# Patient Record
Sex: Female | Born: 1970 | Race: Black or African American | Hispanic: No | Marital: Married | Smoking: Former smoker
Health system: Southern US, Community
[De-identification: ages and names within clinical notes are randomized; demographics above are authoritative.]

## PROBLEM LIST (undated history)

## (undated) DIAGNOSIS — R7303 Prediabetes: Secondary | ICD-10-CM

## (undated) DIAGNOSIS — M542 Cervicalgia: Secondary | ICD-10-CM

## (undated) DIAGNOSIS — K760 Fatty (change of) liver, not elsewhere classified: Secondary | ICD-10-CM

## (undated) DIAGNOSIS — R600 Localized edema: Secondary | ICD-10-CM

## (undated) DIAGNOSIS — E785 Hyperlipidemia, unspecified: Secondary | ICD-10-CM

## (undated) DIAGNOSIS — I10 Essential (primary) hypertension: Secondary | ICD-10-CM

## (undated) DIAGNOSIS — E559 Vitamin D deficiency, unspecified: Secondary | ICD-10-CM

## (undated) DIAGNOSIS — J45909 Unspecified asthma, uncomplicated: Secondary | ICD-10-CM

## (undated) DIAGNOSIS — H409 Unspecified glaucoma: Secondary | ICD-10-CM

## (undated) DIAGNOSIS — T783XXA Angioneurotic edema, initial encounter: Secondary | ICD-10-CM

## (undated) HISTORY — DX: Cervicalgia: M54.2

## (undated) HISTORY — DX: Hyperlipidemia, unspecified: E78.5

## (undated) HISTORY — DX: Morbid (severe) obesity due to excess calories: E66.01

## (undated) HISTORY — DX: Angioneurotic edema, initial encounter: T78.3XXA

## (undated) HISTORY — PX: NECK SURGERY: SHX720

## (undated) HISTORY — DX: Vitamin D deficiency, unspecified: E55.9

## (undated) HISTORY — DX: Unspecified glaucoma: H40.9

## (undated) HISTORY — DX: Unspecified asthma, uncomplicated: J45.909

## (undated) HISTORY — DX: Localized edema: R60.0

## (undated) HISTORY — DX: Essential (primary) hypertension: I10

## (undated) HISTORY — DX: Fatty (change of) liver, not elsewhere classified: K76.0

## (undated) HISTORY — PX: EYE SURGERY: SHX253

## (undated) HISTORY — DX: Prediabetes: R73.03

---

## 2008-03-08 ENCOUNTER — Encounter: Admission: RE | Admit: 2008-03-08 | Discharge: 2008-03-08 | Payer: Self-pay | Admitting: Neurosurgery

## 2008-05-16 ENCOUNTER — Ambulatory Visit (HOSPITAL_COMMUNITY): Admission: RE | Admit: 2008-05-16 | Discharge: 2008-05-17 | Payer: Self-pay | Admitting: Neurosurgery

## 2008-06-10 ENCOUNTER — Encounter: Admission: RE | Admit: 2008-06-10 | Discharge: 2008-06-10 | Payer: Self-pay | Admitting: Neurosurgery

## 2008-12-24 IMAGING — RF DG MYELOGRAM CERVICAL
9 series · 9 of 9 positions shown · IV contrast (omnipaque)
Comparison: Outside MRI from [facility] 02/16/2008

MYELOGRAM  INJECTION
TECHNIQUE: Informed consent was obtained from the patient prior to
the procedure, including potential complications of headache,
allergy, infection and pain. Specific instructions were given
regarding 24 hour bedrest post procedure to prevent post-LP
headache.  A timeout procedure was performed.  With the patient
prone, the lower back was prepped with Betadine.  1% Lidocaine was
used for local anesthesia.  Lumbar puncture was performed by the
radiologist at the L3-2level using a 22 gauge needle with return of
clear colorless  CSF.  Ninecc of Omnipaque 544was injected into the
subarachnoid space .
CLINICAL DATA: Neck pain, right arm pain
TECHNIQUE: Multidetector CT imaging of the cervical spine was
performed following myelography.  Multiplanar CT image
reconstructions were also generated.

[Series 1: myelogram  white · 1 of 1 slices shown (1 of 9)]
[im 1/1]
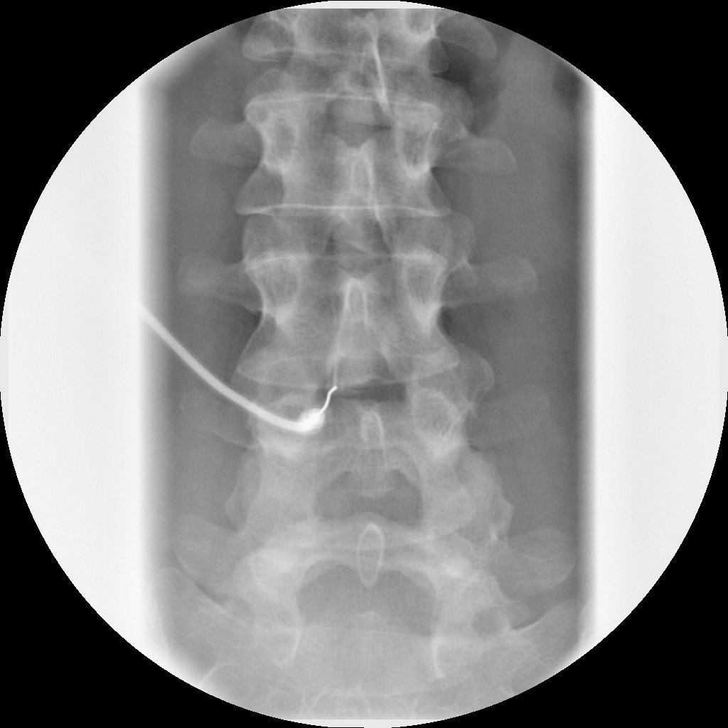

[Series 2: myelogram  white · 1 of 1 slices shown (2 of 9)]
[im 1/1]
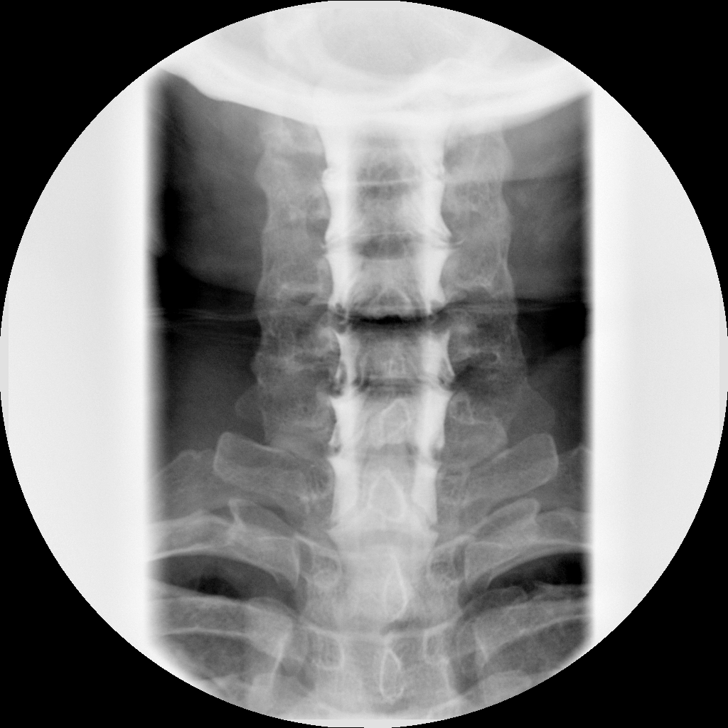

[Series 3: myelogram  white · 1 of 1 slices shown (3 of 9)]
[im 1/1]
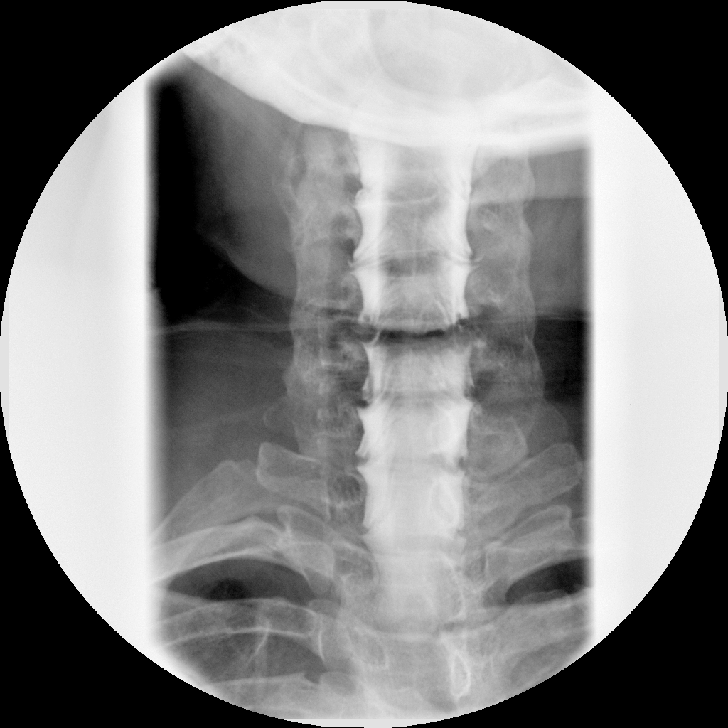

[Series 4: myelogram  white · 1 of 1 slices shown (4 of 9)]
[im 1/1]
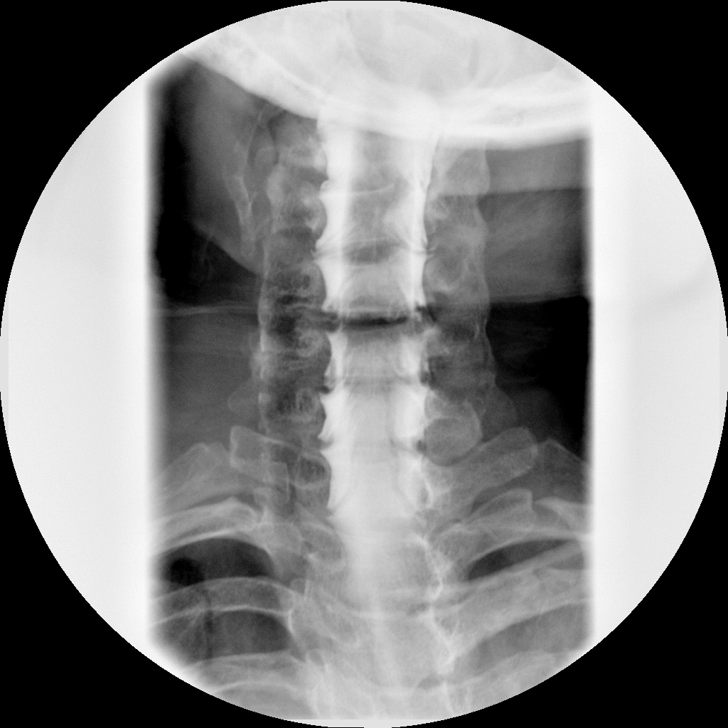

[Series 5: myelogram  white · 1 of 1 slices shown (5 of 9)]
[im 1/1]
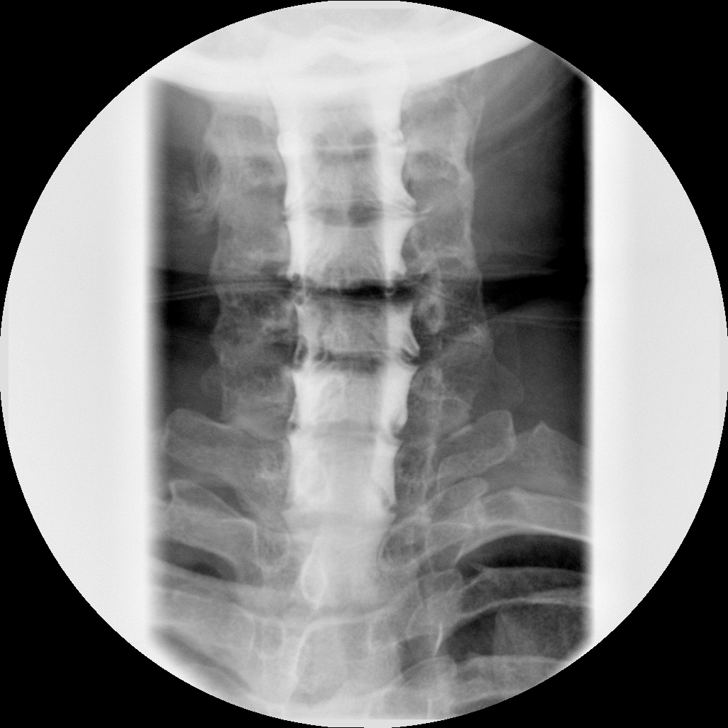

[Series 6: myelogram  white · 1 of 1 slices shown (6 of 9)]
[im 1/1]
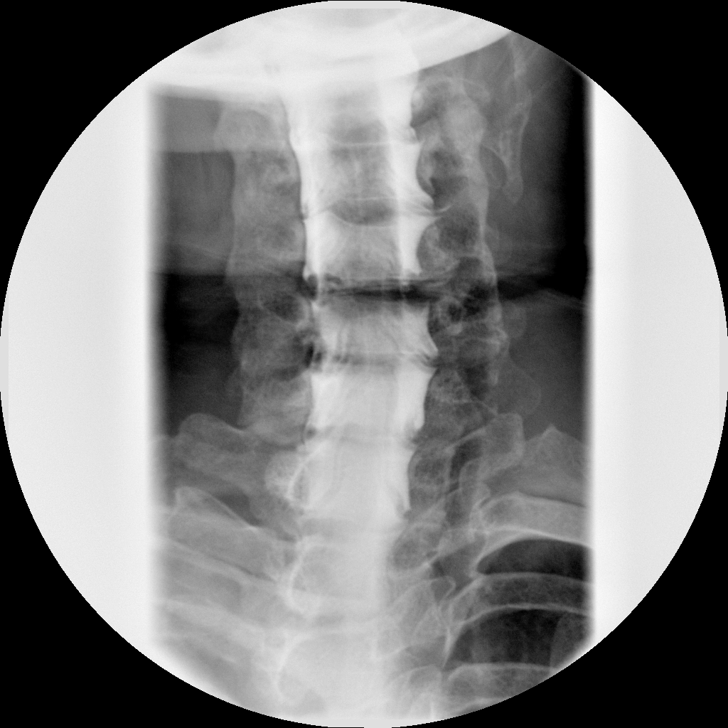

[Series 7: myelogram  white · 1 of 1 slices shown (7 of 9)]
[im 1/1]
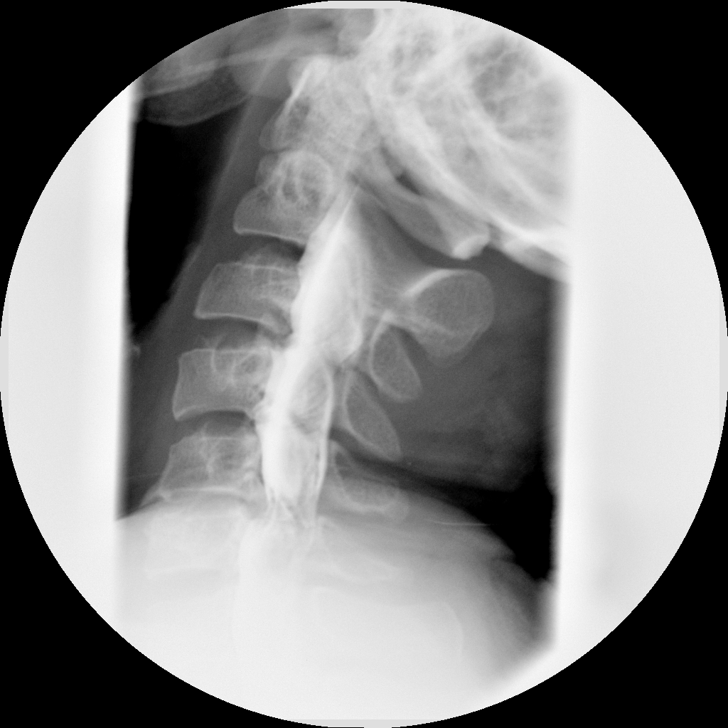

[Series 8: myelogram  white · 1 of 1 slices shown (8 of 9)]
[im 1/1]
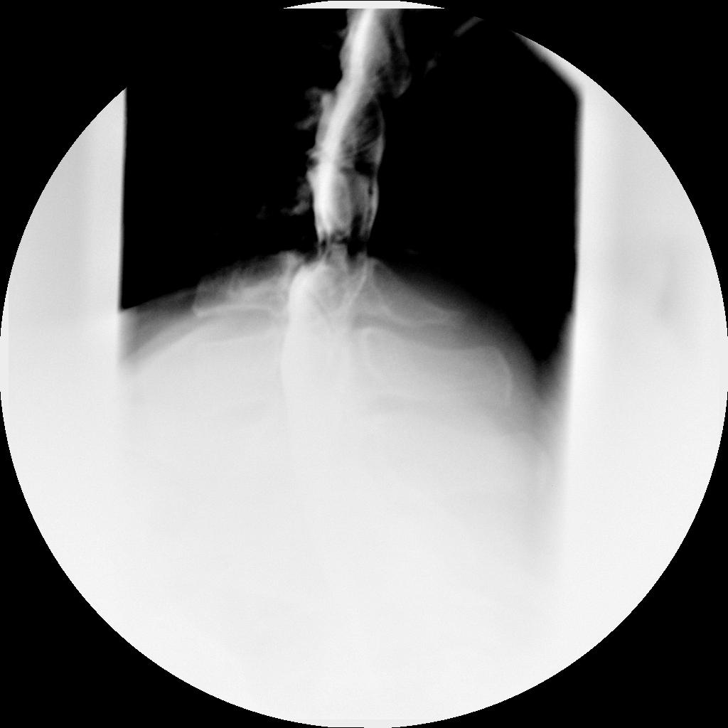

[Series 10: myelogram  white · 1 of 1 slices shown (9 of 9)]
[im 1/1]
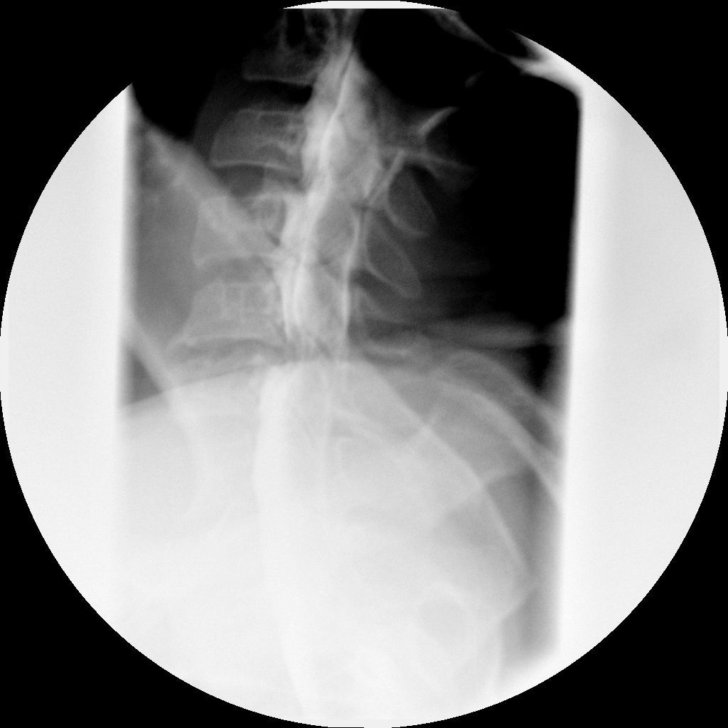

[9 of 9 positions shown; findings below may reference images not displayed]

IMPRESSION: Successful injection of  intrathecal contrast for myelography.

MYELOGRAM CERVICAL
FINDINGS: The contrast is noted in the cervical subarachnoid space.
There is disc space narrowing at C5-6 with a large ventral defect
consisting of soft tissue and bone.  There is significant spinal
stenosis with cord compression.  Bilateral C6 neural encroachment
is identified..  A smaller ventral defect is noted at C3-4.

Fluoroscopy Time: 1.33 minutes
IMPRESSION: As above

CT MYELOGRAPHY CERVICAL SPINE
The individual disc spaces were examined as follows:

C2-3:  Normal.

C3-4:  Central protrusion with effacement of the anterior
subarachnoid space and mild cord flattening.  No definite nerve
root encroachment. Canal diameter 7 mm.

C4-5:  Normal.

C5-6:  Broad-based central disc extrusion with osteophyte formation
above and below.  There is spinal stenosis with cord flattening and
bilateral C6 nerve root encroachment.  Canal diameter 6 mm.
Reversal of the normal cervical lordotic curve at this level.

C6-7:  Normal.

C7-T1:  Normal.

There is good general agreement with the prior MRI.
IMPRESSION: Broad-based central disc extrusion at C5-6 with osteophyte
formation; cord compression and bilateral C6 nerve root
encroachment is observed, exacerbated by reversal of the normal
cervical lordotic curve.

Central protrusion at C3-4, with mild cord flattening but no C4
nerve root encroachment.

## 2009-03-28 IMAGING — CR DG CERVICAL SPINE 1V
1 series · 1 of 1 positions shown · non-contrast
Comparison: Intraoperative films of 05/16/2008

CLINICAL DATA: Surgery 05/16/2008.

CERVICAL SPINE - 1 VIEW

[view not recorded]
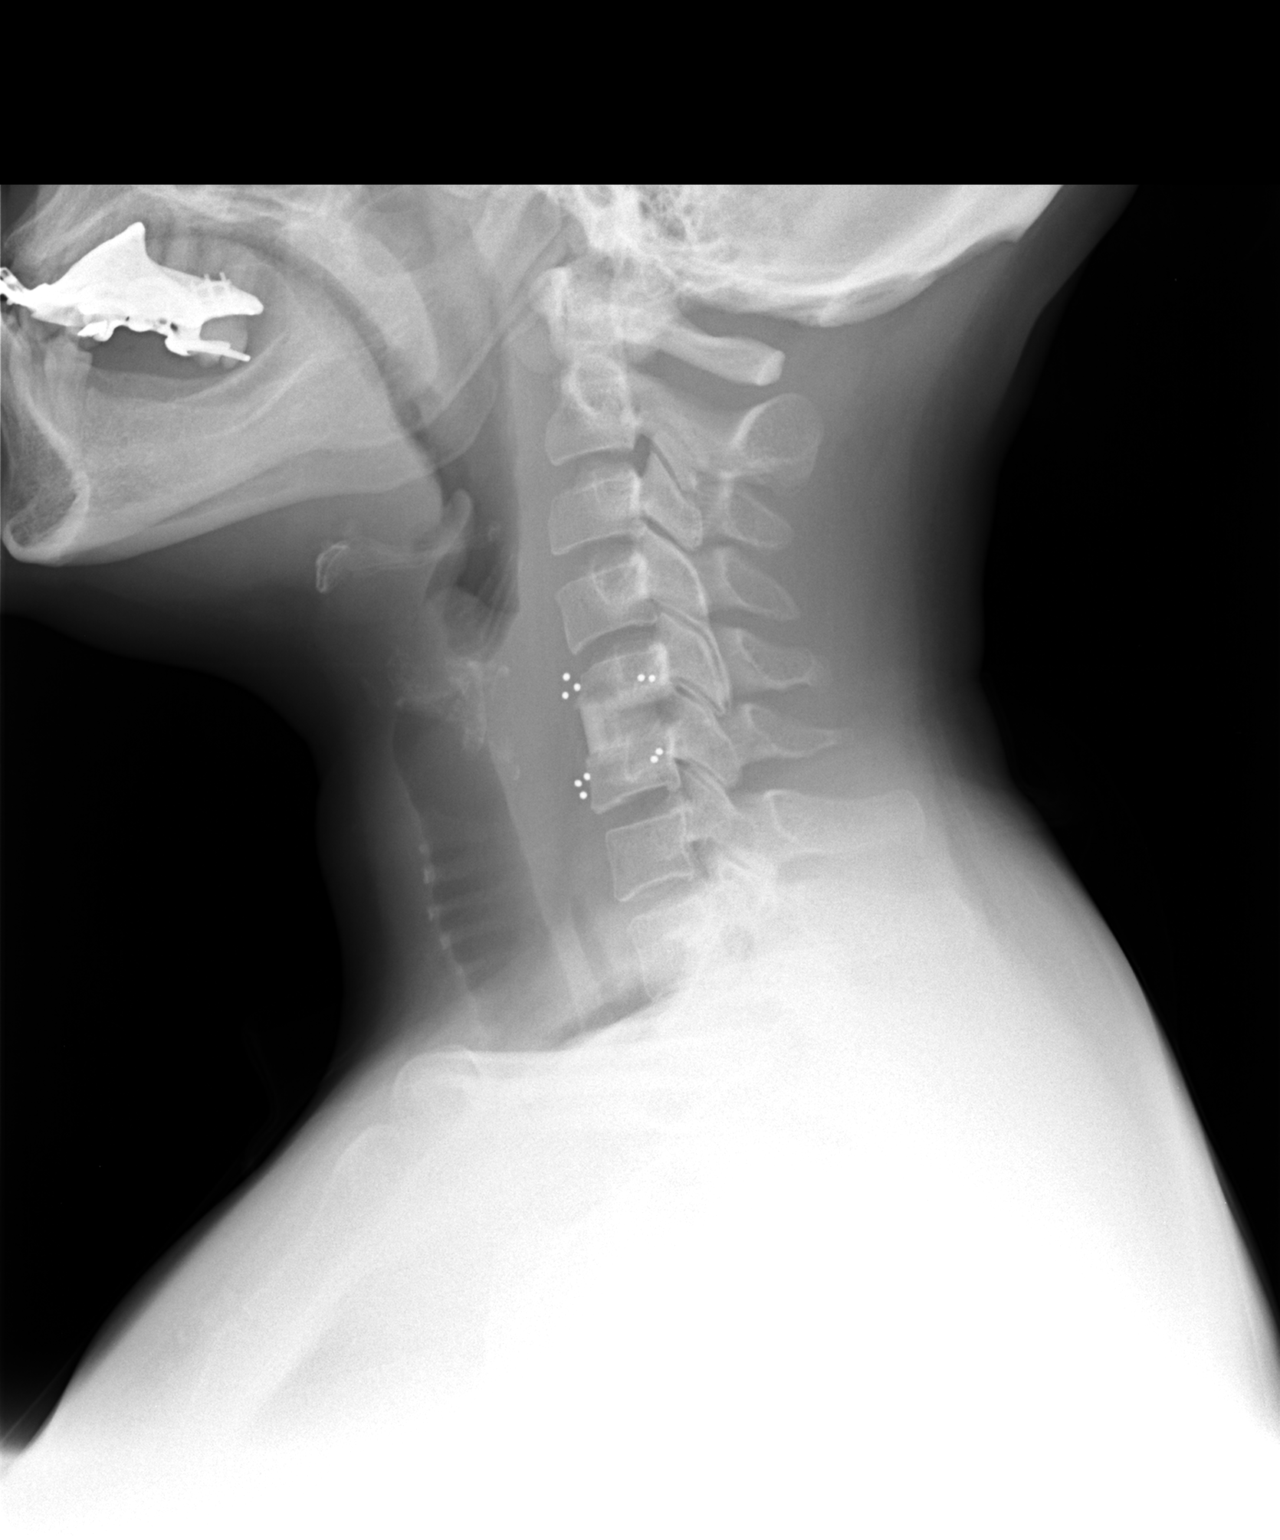

[1 of 1 positions shown; findings below may reference images not displayed]

FINDINGS: Single lateral view images through the bottom of T1.  The
prevertebral soft tissues are within normal limits.  The patient is
status post C5-C6 fixation.  Interbody fusion material is centrally
positioned.  Straightening of expected cervical lordosis is likely
postoperative.  Maintenance of vertebral body height.
IMPRESSION: Status post C5-C6 fixation without acute finding.

REF:G3 DICTATED: 06/10/2008 [DATE]

## 2010-05-24 ENCOUNTER — Encounter: Payer: Self-pay | Admitting: Geriatric Medicine

## 2010-08-17 LAB — CBC
MCV: 90.2 fL (ref 78.0–100.0)
RBC: 4.61 MIL/uL (ref 3.87–5.11)

## 2010-09-15 NOTE — Op Note (Signed)
NAME:  Dorothy Morgan, Dorothy Morgan             ACCOUNT NO.:  [id number]   MEDICAL RECORD NO.:  [id number]          PATIENT TYPE:  OIB   LOCATION:  3599                         FACILITY:  [facility]   PHYSICIAN:  Reinaldo Meeker, M.D. DATE OF BIRTH:  04/15/1971   DATE OF PROCEDURE:  05/16/2008  DATE OF DISCHARGE:                               OPERATIVE REPORT   PREOPERATIVE DIAGNOSIS:  Herniated disk, C5-6.   POSTOPERATIVE DIAGNOSIS:  Herniated disk, C5-6.   PROCEDURE:  C5-6 anterior cervical diskectomy with bone bank fusion  followed by Mystique anterior cervical plating.   SURGEON:  Reinaldo Meeker, MD   ASSISTANT:  Tia Alert, MD   PROCEDURE IN DETAIL:  After placing in the supine position and 5-pound  halter traction, the patient's neck was prepped and draped in the usual  sterile fashion.  Localizing fluoroscopy was used prior to incision to  identify the appropriate level.  Transverse incision was made in the  right anterior neck starting at the midline headed towards the medial  aspect of the sternocleidomastoid muscle.  The platysma muscle was then  incised transversely.  The natural fascial plane between the strap  muscles medially and sternocleidomastoid laterally was identified and  followed down to the anterior aspect of the cervical spine.  Longus  colli muscles were identified, split in the midline, stripped away  bilaterally with the Medical illustrator.  Self-retained  retractor was placed for exposure.  X-rays showed approach to the  appropriate level.  Using a 15 blade, the disk at C5-6 was incised.  Using pituitary rongeurs and curettes, approximately 90% of the disk  material was removed.  High-speed drill was used to widen the  interspace.  The microscope was draped, brought in the field, and used  at the end of the case.  Using microdissection technique, the remainder  of disk material on the post longitudinal ligament was removed.  Ligament was then  incised transversely and the cut edges removed with a  Kerrison punch.  Thorough decompression was carried out bilaterally on  the spinal dura and proximal foramen until the C6 nerve roots were well  visualized bilaterally.  There was a large amount of herniated disk  material within the foramen at C5-6 on the right.  This was removed  which gave excellent decompression of the underlying C6 nerve root.  At  this time, inspection was carried out in all directions for any evidence  of residual compression and none could be identified.  Large amounts of  irrigation were carried out and bleeding controlled with bipolar  coagulation and Gelfoam.  Measurements were taken.  It was elected to  somewhat over-distract the disk space with lordosis where 9 mm lordotic  plug was chosen.  The plug was then tapped without difficulty and  fluoroscopy showed to be in excellent position.  At 23-mm Mystique  anterior cervical plate was then chosen.  Under fluoroscopic guidance,  drill holes were placed followed by tapping, retapping, and placing 13  mm screws x4 until locking mechanism was secured to the plate.  Final  fluoroscopy showed the  plate screws and plug to be in good position.  Large amounts of the irrigation were carried at this time  and bleeding controlled with bipolar coagulation and Gelfoam.  The wound  was then closed with interrupted Vicryl in the platysma muscle and  inverted 5-0 PDS in the subcuticular layer and Steri-Strips on the skin.  Sterile dressing was then applied.  The patient was extubated and taken  to recovery room in stable condition.            ______________________________  Reinaldo Meeker, M.D.     ROK/MEDQ  D:  05/16/2008  T:  05/17/2008  Job:  [id number]

## 2014-05-03 HISTORY — PX: EYE SURGERY: SHX253

## 2014-07-26 DIAGNOSIS — H409 Unspecified glaucoma: Secondary | ICD-10-CM | POA: Insufficient documentation

## 2014-07-26 DIAGNOSIS — G629 Polyneuropathy, unspecified: Secondary | ICD-10-CM

## 2014-07-26 HISTORY — DX: Polyneuropathy, unspecified: G62.9

## 2014-09-07 DIAGNOSIS — K859 Acute pancreatitis without necrosis or infection, unspecified: Secondary | ICD-10-CM

## 2014-09-07 HISTORY — DX: Acute pancreatitis without necrosis or infection, unspecified: K85.90

## 2016-12-07 DIAGNOSIS — S46912A Strain of unspecified muscle, fascia and tendon at shoulder and upper arm level, left arm, initial encounter: Secondary | ICD-10-CM | POA: Insufficient documentation

## 2016-12-07 HISTORY — DX: Strain of unspecified muscle, fascia and tendon at shoulder and upper arm level, left arm, initial encounter: S46.912A

## 2017-04-21 ENCOUNTER — Ambulatory Visit (INDEPENDENT_AMBULATORY_CARE_PROVIDER_SITE_OTHER): Payer: BLUE CROSS/BLUE SHIELD | Admitting: Allergy and Immunology

## 2017-04-21 ENCOUNTER — Encounter: Payer: Self-pay | Admitting: Allergy and Immunology

## 2017-04-21 VITALS — BP 150/92 | HR 80 | Temp 98.5°F | Resp 16 | Ht 60.0 in | Wt 189.0 lb

## 2017-04-21 DIAGNOSIS — T783XXA Angioneurotic edema, initial encounter: Secondary | ICD-10-CM

## 2017-04-21 DIAGNOSIS — Z7722 Contact with and (suspected) exposure to environmental tobacco smoke (acute) (chronic): Secondary | ICD-10-CM

## 2017-04-21 DIAGNOSIS — T7840XA Allergy, unspecified, initial encounter: Secondary | ICD-10-CM | POA: Diagnosis not present

## 2017-04-21 MED ORDER — LORATADINE 10 MG PO TABS: 10.0000 mg | ORAL_TABLET | Freq: Two times a day (BID) | ORAL | 5 refills | Status: DC

## 2017-04-21 MED ORDER — AUVI-Q 0.3 MG/0.3ML IJ SOAJ: INTRAMUSCULAR | 3 refills | Status: DC

## 2017-04-21 MED ORDER — RANITIDINE HCL 150 MG PO TABS: 150.0000 mg | ORAL_TABLET | Freq: Two times a day (BID) | ORAL | 5 refills | Status: DC

## 2017-04-21 MED ORDER — MONTELUKAST SODIUM 10 MG PO TABS: 10.0000 mg | ORAL_TABLET | Freq: Every day | ORAL | 5 refills | Status: DC

## 2017-04-21 NOTE — Progress Notes (Signed)
Dear Dr. Nelson Chimes,  Thank you for referring [name] to the [facility] [place] on 04/21/2017.   Below is a summation of this patient's evaluation and recommendations.  Thank you for your referral. I will keep you informed about this patient's response to treatment.   If you have any questions please do not hesitate to contact me.   Sincerely,  Jessica Priest, MD Allergy / Immunology  [facility]   ______________________________________________________________________    NEW PATIENT NOTE  Referring Provider: Eloisa Northern, MD Primary Provider: Eloisa Northern, MD Date of office visit: 04/21/2017    Subjective:   Chief Complaint:  Dorothy Morgan (DOB: 03-21-1971) is a 46 y.o. female who presents to the clinic on 04/21/2017 with a chief complaint of Angioedema .     HPI: [place] presents to this clinic in evaluation of recurrent allergic reactions.  Over the course of the past month she has had 8 episodes of face, lip, tongue swelling without any associated systemic or constitutional symptoms and without any obvious trigger. Some of these episodes have been associated with difficulty swallowing and difficulty breathing when she lays down. Each episode lasts about 1-2 days and she has been administered systemic steroid at least 4 times for this issue.   She denies any significant health issue that has developed the past 6 months and she is not using any new medication of OTC agents the past 6 months and her environment has not changed significantly the past 6 months. She has not had any new GI or respiratory symptoms develop the past 6 months.  Past Medical History:  Diagnosis Date  . Angio-edema   . Asthma   . Glaucoma     Past Surgical History:  Procedure Laterality Date  . CESAREAN SECTION  1987  . EYE SURGERY Bilateral 2016  . NECK SURGERY  2010,2015    Allergies as  of 04/21/2017   No Known Allergies     Medication List      furosemide 40 MG tablet Commonly known as:  LASIX Take 40 mg by mouth as needed.   potassium chloride 10 MEQ tablet Commonly known as:  K-DUR Take 10 mEq by mouth as needed.   PROAIR HFA 108 (90 Base) MCG/ACT inhaler Generic drug:  albuterol Inhale 2 puffs into the lungs every 4 (four) hours as needed for wheezing or shortness of breath.       Review of systems negative except as noted in HPI / PMHx or noted below:  Review of Systems  Constitutional: Negative.   HENT: Negative.   Eyes: Negative.   Respiratory: Negative.   Cardiovascular: Negative.   Gastrointestinal: Negative.   Genitourinary: Negative.   Musculoskeletal: Negative.   Skin: Negative.   Neurological: Negative.   Endo/Heme/Allergies: Negative.   Psychiatric/Behavioral: Negative.     Family History  Problem Relation Age of Onset  . Asthma Mother   . High blood pressure Mother   . Diabetes Mother   . High blood pressure Sister   . Aneurysm Sister   . High blood pressure Brother   . High blood pressure Sister   . High blood pressure Brother     Social History   Socioeconomic History  . Marital status: Married    Spouse name: Not on file  . Number of children: Not on file  . Years of education: Not on file  . Highest education level:  Not on file  Social Needs  . Financial resource strain: Not on file  . Food insecurity - worry: Not on file  . Food insecurity - inability: Not on file  . Transportation needs - medical: Not on file  . Transportation needs - non-medical: Not on file  Occupational History  . Not on file  Tobacco Use  . Smoking status: Former Smoker    Packs/day: 1.00    Years: 20.00    Pack years: 20.00    Types: Cigarettes  . Smokeless tobacco: Never Used  Substance and Sexual Activity  . Alcohol use: Yes  . Drug use: No  . Sexual activity: Not on file  Other Topics Concern  . Not on file  Social History  Narrative  . Not on file    Environmental and Social history  Lives in a house with a dry environment, no animals located inside the household, carpet in the bedroom, plastic on the bed, no plastic on the pillow, or exposure to tobacco smoke inside the household, and working in a warehouse setting with shipping and receiving.  Objective:   Vitals:   04/21/17 0903  BP: (!) 150/92  Pulse: 80  Resp: 16  Temp: 98.5 F (36.9 C)   Height: 5' (152.4 cm) Weight: 189 lb (85.7 kg)  Physical Exam  Constitutional: She is well-developed, well-nourished, and in no distress.  HENT:  Head: Normocephalic. Head is without right periorbital erythema and without left periorbital erythema.  Right Ear: Tympanic membrane, external ear and ear canal normal.  Left Ear: Tympanic membrane, external ear and ear canal normal.  Nose: Nose normal. No mucosal edema or rhinorrhea.  Mouth/Throat: Oropharynx is clear and moist and mucous membranes are normal. No oropharyngeal exudate.  Eyes: Conjunctivae and lids are normal. Pupils are equal, round, and reactive to light.  Neck: Trachea normal. No tracheal deviation present. No thyromegaly present.  Cardiovascular: Normal rate, regular rhythm, S1 normal, S2 normal and normal heart sounds.  No murmur heard. Pulmonary/Chest: Effort normal. No stridor. No tachypnea. No respiratory distress. She has no wheezes. She has no rales. She exhibits no tenderness.  Abdominal: Soft. She exhibits no distension and no mass. There is no hepatosplenomegaly. There is no tenderness. There is no rebound and no guarding.  Musculoskeletal: She exhibits no edema or tenderness.  Lymphadenopathy:       Head (right side): No tonsillar adenopathy present.       Head (left side): No tonsillar adenopathy present.    She has no cervical adenopathy.    She has no axillary adenopathy.  Neurological: She is alert. Gait normal.  Skin: No rash noted. She is not diaphoretic. No erythema. No  pallor. Nails show no clubbing.  Psychiatric: Mood and affect normal.    Diagnostics: Allergy skin tests were performed. She demonstrated sensitivity to trees and molds and no sensitivity to foods  Assessment and Plan:    1. Allergic reaction, initial encounter   2. Angioedema, initial encounter   3. Second hand smoke exposure     1.  Allergen avoidance measures? Eliminate all indoor / car tobacco smoke exposure  2.  Use preventative medication plan:   A.  Loratadine 10 mg twice a day  B.  Ranitidine 150 mg twice a day  C.  Montelukast 10 mg once a day  3. Auvi-Q 0.3, Benadryl, MD/ER evaluation for allergic reaction  4. Blood - CBC w/diff, CMP, SED, CRP, C4, C1 esterase inhibitor level and function, C1q, TSH, T4,  TP, alpha-gal panel, + UA  5. 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  We will have her utilize preventative medications as noted above in an attempt to decrease immunological hyperreactivity and investigate her blood for source of this overactivity as noted above.  I will contact her with the results of her blood tests once they are available for review.  Further evaluation and treatment will be based upon her response to treatment and the results of her diagnostic testing.  Jessica PriestEric J. Laurell Coalson, MD Allergy / Immunology [facility] [place]

## 2017-04-21 NOTE — Patient Instructions (Addendum)
  1.  Allergen avoidance measures? Eliminate all indoor / car tobacco smoke exposure  2.  Use preventative medication plan:   A.  Loratadine 10 mg twice a day  B.  Ranitidine 150 mg twice a day  C.  Montelukast 10 mg once a day  3. Auvi-Q 0.3, Benadryl, MD/ER evaluation for allergic reaction  4. Blood - CBC w/diff, CMP, SED, CRP, C4, C1 esterase inhibitor level and function, C1q, TSH, T4, TP, alpha-gal panel, + UA  5. Return in 4 weeks or earlier if problem

## 2017-04-27 ENCOUNTER — Encounter: Payer: Self-pay | Admitting: Allergy and Immunology

## 2017-04-28 LAB — COMPREHENSIVE METABOLIC PANEL
ALK PHOS: 84 IU/L (ref 39–117)
ALT: 22 IU/L (ref 0–32)
AST: 19 IU/L (ref 0–40)
Albumin/Globulin Ratio: 1.4 (ref 1.2–2.2)
Albumin: 4.2 g/dL (ref 3.5–5.5)
BILIRUBIN TOTAL: 0.2 mg/dL (ref 0.0–1.2)
BUN/Creatinine Ratio: 9 (ref 9–23)
BUN: 7 mg/dL (ref 6–24)
CHLORIDE: 102 mmol/L (ref 96–106)
CO2: 23 mmol/L (ref 20–29)
Calcium: 9.3 mg/dL (ref 8.7–10.2)
Creatinine, Ser: 0.82 mg/dL (ref 0.57–1.00)
GFR calc Af Amer: 98 mL/min/{1.73_m2} (ref >59)
GFR calc non Af Amer: 85 mL/min/{1.73_m2} (ref >59)
GLUCOSE: 98 mg/dL (ref 65–99)
Globulin, Total: 3.1 g/dL (ref 1.5–4.5)
POTASSIUM: 4 mmol/L (ref 3.5–5.2)
Sodium: 139 mmol/L (ref 134–144)
TOTAL PROTEIN: 7.3 g/dL (ref 6.0–8.5)

## 2017-04-28 LAB — CBC WITH DIFFERENTIAL/PLATELET
BASOS ABS: 0 10*3/uL (ref 0.0–0.2)
Basos: 1 %
EOS (ABSOLUTE): 0.1 10*3/uL (ref 0.0–0.4)
Eos: 2 %
Hematocrit: 40.6 % (ref 34.0–46.6)
Hemoglobin: 13.7 g/dL (ref 11.1–15.9)
IMMATURE GRANS (ABS): 0 10*3/uL (ref 0.0–0.1)
Immature Granulocytes: 0 %
LYMPHS: 49 %
Lymphocytes Absolute: 2.7 10*3/uL (ref 0.7–3.1)
MCH: 30.6 pg (ref 26.6–33.0)
MCHC: 33.7 g/dL (ref 31.5–35.7)
MCV: 91 fL (ref 79–97)
MONOS ABS: 0.3 10*3/uL (ref 0.1–0.9)
Monocytes: 6 %
NEUTROS ABS: 2.3 10*3/uL (ref 1.4–7.0)
NEUTROS PCT: 42 %
PLATELETS: 443 10*3/uL — AB (ref 150–379)
RBC: 4.48 x10E6/uL (ref 3.77–5.28)
RDW: 13.6 % (ref 12.3–15.4)
WBC: 5.5 10*3/uL (ref 3.4–10.8)

## 2017-04-28 LAB — URINALYSIS
Bilirubin, UA: NEGATIVE
Glucose, UA: NEGATIVE
Ketones, UA: NEGATIVE
LEUKOCYTES UA: NEGATIVE
NITRITE UA: NEGATIVE
PH UA: 6 (ref 5.0–7.5)
Protein, UA: NEGATIVE
Specific Gravity, UA: 1.007 (ref 1.005–1.030)
UUROB: 0.2 mg/dL (ref 0.2–1.0)

## 2017-04-28 LAB — ALPHA-GAL PANEL
ALPHA GAL IGE: 0.28 kU/L — AB (ref <0.10)
BEEF CLASS INTERPRETATION: 0
Beef (Bos spp) IgE: <0.1 kU/L (ref <0.35)
LAMB CLASS INTERPRETATION: 0
PORK CLASS INTERPRETATION: 0
Pork (Sus spp) IgE: <0.1 kU/L (ref <0.35)

## 2017-04-28 LAB — TSH+FREE T4
Free T4: 1.24 ng/dL (ref 0.82–1.77)
TSH: 2.16 u[IU]/mL (ref 0.450–4.500)

## 2017-04-28 LAB — C4 COMPLEMENT: COMPLEMENT C4, SERUM: 34 mg/dL (ref 14–44)

## 2017-04-28 LAB — C1 ESTERASE INHIBITOR: C1INH SerPl-mCnc: 34 mg/dL (ref 21–39)

## 2017-04-28 LAB — THYROID PEROXIDASE ANTIBODY: Thyroperoxidase Ab SerPl-aCnc: 12 IU/mL (ref 0–34)

## 2017-04-28 LAB — COMPLEMENT COMPONENT C1Q: COMPLEMENT C1Q: 15.6 mg/dL (ref 11.8–24.4)

## 2017-04-28 LAB — C-REACTIVE PROTEIN: CRP: 10 mg/L — AB (ref 0.0–4.9)

## 2017-04-28 LAB — C1 ESTERASE INHIBITOR, FUNCTIONAL: C1INH Functional/C1INH Total MFr SerPl: 98 %mean normal

## 2017-04-28 LAB — SEDIMENTATION RATE: SED RATE: 11 mm/h (ref 0–32)

## 2017-05-20 DIAGNOSIS — H4020X0 Unspecified primary angle-closure glaucoma, stage unspecified: Secondary | ICD-10-CM

## 2017-05-20 HISTORY — DX: Unspecified primary angle-closure glaucoma, stage unspecified: H40.20X0

## 2017-05-23 ENCOUNTER — Encounter: Payer: Self-pay | Admitting: Allergy

## 2017-05-23 ENCOUNTER — Ambulatory Visit (INDEPENDENT_AMBULATORY_CARE_PROVIDER_SITE_OTHER): Payer: BLUE CROSS/BLUE SHIELD | Admitting: Allergy

## 2017-05-23 VITALS — BP 126/90 | HR 72 | Resp 16

## 2017-05-23 DIAGNOSIS — T7840XD Allergy, unspecified, subsequent encounter: Secondary | ICD-10-CM | POA: Diagnosis not present

## 2017-05-23 DIAGNOSIS — T783XXD Angioneurotic edema, subsequent encounter: Secondary | ICD-10-CM | POA: Diagnosis not present

## 2017-05-23 DIAGNOSIS — Z7722 Contact with and (suspected) exposure to environmental tobacco smoke (acute) (chronic): Secondary | ICD-10-CM | POA: Diagnosis not present

## 2017-05-23 NOTE — Patient Instructions (Addendum)
  1.  Avoid red meats  2.  Eliminate all indoor / car tobacco smoke exposure   3.  Use preventative medication plan:   A.  Loratadine 10 mg twice a day  B.  Ranitidine 150 mg twice a day  C.  Montelukast 10 mg once a day  4. Auvi-Q 0.3, Benadryl, MD/ER evaluation for allergic reaction  5. Will repeat CRP (inflammatory marker) to determine if rising, stable or dropping.  If level is normal or dropping nothing further to work-up.    6. Return in 3 months or earlier if problem

## 2017-05-23 NOTE — Progress Notes (Signed)
Follow-up Note  RE: Dorothy Morgan MRN: [id number] DOB: January 10, 1971 Date of Office Visit: 05/23/2017   History of present illness: Dorothy Morgan is a 47 y.o. female presenting today for follow-up of angioedema.  She was seen in the office for initial visit on April 21, 2017 by Dr. Lucie LeatherKozlow.  At this visit she was recommended to take loratadine twice a day, ranitidine twice a day as well as montelukast daily.  She states she has been taking loratadine twice a day, ranitidine once a day and singular once a day.  With this regimen she is only had one episode where her lips started to tingle as if he could swell and she took Benadryl and this prevented any swelling.  Prior to starting on this regimen she was having at least 8 episodes or so a month.  She definitely states this is an improvement.  She has been avoiding red meats based on labs that returned.  She also has access to auviq device.  She otherwise denies any changes with her health, surgeries or hospitalizations since last visit. She does state when she had her urinalysis done that she was on her menstrual cycle however this was not documented with the actual lab that showed 3+ RBCs.  Review of systems: Review of Systems  Constitutional: Negative for chills, fever and malaise/fatigue.  HENT: Negative for congestion, ear discharge, ear pain, nosebleeds, sore throat and tinnitus.   Eyes: Negative for pain, discharge and redness.  Respiratory: Negative for cough, shortness of breath and wheezing.   Cardiovascular: Negative for chest pain.  Gastrointestinal: Negative for abdominal pain, constipation, diarrhea, heartburn, nausea and vomiting.  Musculoskeletal: Negative for joint pain.  Skin: Negative for itching and rash.  Neurological: Negative for headaches.    All other systems negative unless noted above in HPI  Past medical/social/surgical/family history have been reviewed and are unchanged unless specifically  indicated below.  No changes  Medication List: Allergies as of 05/23/2017   No Known Allergies     Medication List        Accurate as of 05/23/17 12:04 PM. Always use your most recent med list.          AUVI-Q 0.3 mg/0.3 mL Soaj injection Generic drug:  EPINEPHrine Use as directed for life-threatening allergic reaction.   furosemide 40 MG tablet Commonly known as:  LASIX Take 40 mg by mouth as needed.   loratadine 10 MG tablet Commonly known as:  CLARITIN Take 1 tablet (10 mg total) by mouth 2 (two) times daily.   montelukast 10 MG tablet Commonly known as:  SINGULAIR Take 1 tablet (10 mg total) by mouth at bedtime.   potassium chloride 10 MEQ tablet Commonly known as:  K-DUR Take 10 mEq by mouth as needed.   PROAIR HFA 108 (90 Base) MCG/ACT inhaler Generic drug:  albuterol Inhale 2 puffs into the lungs every 4 (four) hours as needed for wheezing or shortness of breath.   ranitidine 150 MG tablet Commonly known as:  ZANTAC Take 1 tablet (150 mg total) by mouth 2 (two) times daily.       Known medication allergies: No Known Allergies   Physical examination: Blood pressure 126/90, pulse 72, resp. rate 16.  General: Alert, interactive, in no acute distress. HEENT: PERRLA, TMs pearly gray, turbinates non-edematous without discharge, post-pharynx non erythematous. Neck: Supple without lymphadenopathy. Lungs: Clear to auscultation without wheezing, rhonchi or rales. {no increased work of breathing. CV: Normal S1, S2 without murmurs. Abdomen:  Nondistended, nontender. Skin: Warm and dry, without lesions or rashes. Extremities:  No clubbing, cyanosis or edema. Neuro:   Grossly intact.  Diagnositics/Labs: Labs:  Component     Latest Ref Rng & Units 04/21/2017  C1INH Functional/C1INH Total MFr SerPl     %mean normal 98   Component     Latest Ref Rng & Units 04/21/2017  Complement C4, Serum     14 - 44 mg/dL 34  Complement W0J     11.8 - 24.4 mg/dL 81.1     Component     Latest Ref Rng & Units 04/21/2017  Beef (Bos spp) IgE     <0.35 kU/L <0.10  Class Interpretation      0  Lamb/Mutton (Ovis spp) IgE     <0.35 kU/L <0.10  Class Interpretation      0  Pork (Sus spp) IgE     <0.35 kU/L <0.10  Class Interpretation      0  Alpha Gal IgE*     <0.10 kU/L 0.28 (H)   Component     Latest Ref Rng & Units 04/21/2017  Specific Gravity, UA     1.005 - 1.030 1.007  pH, UA     5.0 - 7.5 6.0  Color, UA     Yellow Yellow  Appearance Ur     Clear Clear  Leukocytes, UA     Negative Negative  Protein, UA     Negative/Trace Negative  Glucose     Negative Negative  Ketones, UA     Negative Negative  RBC, UA     Negative 3+ (A)  Bilirubin, UA     Negative Negative  Urobilinogen, Ur     0.2 - 1.0 mg/dL 0.2  Nitrite, UA     Negative Negative  RBCs due to menses  Component     Latest Ref Rng & Units 04/21/2017  TSH     0.450 - 4.500 uIU/mL 2.160  T4,Free(Direct)     0.82 - 1.77 ng/dL 9.14  Thyroperoxidase Ab SerPl-aCnc     0 - 34 IU/mL 12   Component     Latest Ref Rng & Units 04/21/2017  Sed Rate     0 - 32 mm/hr 11  CRP     0.0 - 4.9 mg/L 10.0 (H)   Component     Latest Ref Rng & Units 04/21/2017  Glucose     65 - 99 mg/dL 98  BUN     6 - 24 mg/dL 7  Creatinine     7.82 - 1.00 mg/dL 9.56  GFR, Est Non African American     >59 mL/min/1.73 85  GFR, Est African American     >59 mL/min/1.73 98  BUN/Creatinine Ratio     9 - 23 9  Sodium     134 - 144 mmol/L 139  Potassium     3.5 - 5.2 mmol/L 4.0  Chloride     96 - 106 mmol/L 102  CO2     20 - 29 mmol/L 23  Calcium     8.7 - 10.2 mg/dL 9.3  Total Protein     6.0 - 8.5 g/dL 7.3  Albumin     3.5 - 5.5 g/dL 4.2  Globulin, Total     1.5 - 4.5 g/dL 3.1  Albumin/Globulin Ratio     1.2 - 2.2 1.4  Total Bilirubin     0.0 - 1.2 mg/dL 0.2  Alkaline Phosphatase     39 -  117 IU/L 84  AST     0 - 40 IU/L 19  ALT     0 - 32 IU/L 22   Component     Latest  Ref Rng & Units 04/21/2017  WBC     3.4 - 10.8 x10E3/uL 5.5  RBC     3.77 - 5.28 x10E6/uL 4.48  Hemoglobin     11.1 - 15.9 g/dL 16.1  HCT     09.6 - 04.5 % 40.6  MCV     79 - 97 fL 91  MCH     26.6 - 33.0 pg 30.6  MCHC     31.5 - 35.7 g/dL 40.9  RDW     81.1 - 91.4 % 13.6  Platelets     150 - 379 x10E3/uL 443 (H)  Neutrophils     Not Estab. % 42  Lymphs     Not Estab. % 49  Monocytes     Not Estab. % 6  Eos     Not Estab. % 2  Basos     Not Estab. % 1  NEUT#     1.4 - 7.0 x10E3/uL 2.3  Lymphocyte #     0.7 - 3.1 x10E3/uL 2.7  Monocytes Absolute     0.1 - 0.9 x10E3/uL 0.3  EOS (ABSOLUTE)     0.0 - 0.4 x10E3/uL 0.1  Basophils Absolute     0.0 - 0.2 x10E3/uL 0.0  Immature Granulocytes     Not Estab. % 0  Immature Grans (Abs)     0.0 - 0.1 x10E3/uL 0.0   Assessment and plan:   Allergic reaction Angioedema Secondhand smoke exposure  She has had decreased episodes of angioedema with the use of preventative medications below of which she will continue at this time.  I advised that she needs to go a certain period of time without symptoms before considering weaning her medications.  1.  Avoid red meats.  She has low-level sensitivity to alpha gal IgE.   2.  Eliminate all indoor / car tobacco smoke exposure   3.  Continue preventative medication plan:   A.  Loratadine 10 mg twice a day  B.  Ranitidine 150 mg twice a day  C.  Montelukast 10 mg once a day  4. Auvi-Q 0.3, Benadryl, MD/ER evaluation for allergic reaction  5. Will repeat CRP (inflammatory marker) to determine if rising, stable or dropping.  If level is normal or dropping no additional testing needed.    6. Return in 3 months or earlier if problem   I appreciate the opportunity to take part in Chellsie's care. Please do not hesitate to contact me with questions.  Sincerely,   Margo Aye, MD Allergy/Immunology [facility]

## 2017-05-24 LAB — C-REACTIVE PROTEIN: CRP: 7.1 mg/L — AB (ref 0.0–4.9)

## 2017-08-04 DIAGNOSIS — H52221 Regular astigmatism, right eye: Secondary | ICD-10-CM | POA: Diagnosis not present

## 2017-08-04 DIAGNOSIS — H401132 Primary open-angle glaucoma, bilateral, moderate stage: Secondary | ICD-10-CM | POA: Diagnosis not present

## 2017-08-09 DIAGNOSIS — M7989 Other specified soft tissue disorders: Secondary | ICD-10-CM | POA: Diagnosis not present

## 2017-08-09 DIAGNOSIS — J45909 Unspecified asthma, uncomplicated: Secondary | ICD-10-CM | POA: Diagnosis not present

## 2017-08-09 DIAGNOSIS — R7303 Prediabetes: Secondary | ICD-10-CM | POA: Diagnosis not present

## 2017-08-09 DIAGNOSIS — B373 Candidiasis of vulva and vagina: Secondary | ICD-10-CM | POA: Diagnosis not present

## 2017-08-23 ENCOUNTER — Ambulatory Visit (INDEPENDENT_AMBULATORY_CARE_PROVIDER_SITE_OTHER): Payer: BLUE CROSS/BLUE SHIELD | Admitting: Allergy

## 2017-08-23 ENCOUNTER — Encounter: Payer: Self-pay | Admitting: Allergy

## 2017-08-23 VITALS — BP 124/80 | HR 80 | Resp 18

## 2017-08-23 DIAGNOSIS — T783XXD Angioneurotic edema, subsequent encounter: Secondary | ICD-10-CM

## 2017-08-23 DIAGNOSIS — Z7722 Contact with and (suspected) exposure to environmental tobacco smoke (acute) (chronic): Secondary | ICD-10-CM

## 2017-08-23 DIAGNOSIS — T7840XD Allergy, unspecified, subsequent encounter: Secondary | ICD-10-CM | POA: Diagnosis not present

## 2017-08-23 NOTE — Patient Instructions (Signed)
  1.  Avoid red meats  2.  Eliminate all indoor / car tobacco smoke exposure   3.  Use preventative medication plan (will start to wean as follows):   A.  Loratadine 10 mg twice a day  B.  Ranitidine 150 mg twice a day  C.  Montelukast 10 mg once a day  Weaning schedule - take away evening dose of ranitidine x 1 week.  If no reaction/swelling then take away morning dose of ranitidine x 1 week.  If no reaction/swelling then take away evening dose of loratadine and continue loratidine and montelukast daily.    If at any point in the wean you develop reaction/swelling then add back in the medication dose last weaned.    4. Auvi-Q 0.3, Benadryl, MD/ER evaluation for allergic reaction  5. For nasal congestion/drainage (seen on exam) use nasacort 2 sprays each nostril daily.  Use for 1-2 weeks at time before stopping once symptoms improve.   6. Return in 4-6 months or earlier if problem

## 2017-08-23 NOTE — Progress Notes (Signed)
Follow-up Note  RE: Dorothy Morgan MRN: [id number] DOB: 09-18-1970 Date of Office Visit: 08/23/2017   History of present illness: Dorothy Morgan is a 47 y.o. female presenting today for follow-up of angioedema.  She was last seen in the office on 05/23/17 by myself.  Since his visit she has not had any further episodes of angioedema.  She is taking loratadine 1 tab twice a day, ranitidine 1 tab twice a day and singulair daily.  She does state that she has on several occasion has had red meat of which she has sensitivity to alpha gal.  She states she will take benadryl prior and after eating as well has having her epinephrine device.  She states no reactions have occurred with red meat ingestion.  She denies any major health changes, surgeries or hospitalizations since last visit.    Review of systems: Review of Systems  Constitutional: Negative for chills, fever and malaise/fatigue.  HENT: Negative for congestion, ear discharge, ear pain, nosebleeds, sinus pain and sore throat.   Eyes: Negative for pain, discharge and redness.  Respiratory: Negative for cough, shortness of breath and wheezing.   Cardiovascular: Negative for chest pain.  Gastrointestinal: Negative for abdominal pain, constipation, diarrhea, heartburn, nausea and vomiting.  Musculoskeletal: Negative for joint pain.  Skin: Negative for itching and rash.  Neurological: Negative for headaches.    All other systems negative unless noted above in HPI  Past medical/social/surgical/family history have been reviewed and are unchanged unless specifically indicated below.  No changes  Medication List: Allergies as of 08/23/2017      Reactions   Beef Extract Swelling   Alpha Gal       Medication List        Accurate as of 08/23/17  3:16 PM. Always use your most recent med list.          AUVI-Q 0.3 mg/0.3 mL Soaj injection Generic drug:  EPINEPHrine Use as directed for life-threatening allergic  reaction.   furosemide 40 MG tablet Commonly known as:  LASIX Take 40 mg by mouth as needed.   loratadine 10 MG tablet Commonly known as:  CLARITIN Take 1 tablet (10 mg total) by mouth 2 (two) times daily.   montelukast 10 MG tablet Commonly known as:  SINGULAIR Take 1 tablet (10 mg total) by mouth at bedtime.   potassium chloride 10 MEQ tablet Commonly known as:  K-DUR Take 10 mEq by mouth as needed.   PROAIR HFA 108 (90 Base) MCG/ACT inhaler Generic drug:  albuterol Inhale 2 puffs into the lungs every 4 (four) hours as needed for wheezing or shortness of breath.   ranitidine 150 MG tablet Commonly known as:  ZANTAC Take 1 tablet (150 mg total) by mouth 2 (two) times daily.       Known medication allergies: Allergies  Allergen Reactions  . Beef Extract Swelling    Alpha Gal        Physical examination: Blood pressure 124/80, pulse 80, resp. rate 18.  General: Alert, interactive, in no acute distress. HEENT: PERRLA, TMs pearly gray, turbinates moderately edematous with clear discharge, post-pharynx non erythematous. Neck: Supple without lymphadenopathy. Lungs: Clear to auscultation without wheezing, rhonchi or rales. {no increased work of breathing. CV: Normal S1, S2 without murmurs. Abdomen: Nondistended, nontender. Skin: Warm and dry, without lesions or rashes. Extremities:  No clubbing, cyanosis or edema. Neuro:   Grossly intact.  Diagnositics/Labs: None today  Assessment and plan:   Angioedema/allergic reaction -  She  has not had further swelling or reactions since last visit.  Thus at this time will try to wean off medications as below.   Second hand smoke exposure  1.  Avoid red meats  2.  Eliminate all indoor / car tobacco smoke exposure   3.  Use preventative medication plan (will start to wean as follows):   A.  Loratadine 10 mg twice a day  B.  Ranitidine 150 mg twice a day  C.  Montelukast 10 mg once a day  Weaning schedule - take away  evening dose of ranitidine x 1 week.  If no reaction/swelling then take away morning dose of ranitidine x 1 week.  If no reaction/swelling then take away evening dose of loratadine and continue loratidine and montelukast daily.    If at any point in the wean you develop reaction/swelling then add back in the medication dose last weaned.    4. Auvi-Q 0.3, Benadryl, MD/ER evaluation for allergic reaction  5. For nasal congestion/drainage (seen on exam) use nasacort 2 sprays each nostril daily.  Use for 1-2 weeks at time before stopping once symptoms improve.   6. Return in 4-6 months or earlier if problem   I appreciate the opportunity to take part in Steffany's care. Please do not hesitate to contact me with questions.  Sincerely,   Margo AyeShaylar Rhema Boyett, MD Allergy/Immunology [facility]

## 2017-12-01 DIAGNOSIS — R1013 Epigastric pain: Secondary | ICD-10-CM | POA: Diagnosis not present

## 2017-12-01 DIAGNOSIS — M545 Low back pain: Secondary | ICD-10-CM | POA: Diagnosis not present

## 2017-12-01 DIAGNOSIS — R109 Unspecified abdominal pain: Secondary | ICD-10-CM | POA: Diagnosis not present

## 2017-12-01 DIAGNOSIS — R1012 Left upper quadrant pain: Secondary | ICD-10-CM | POA: Diagnosis not present

## 2017-12-15 DIAGNOSIS — I10 Essential (primary) hypertension: Secondary | ICD-10-CM | POA: Insufficient documentation

## 2017-12-15 DIAGNOSIS — E119 Type 2 diabetes mellitus without complications: Secondary | ICD-10-CM

## 2017-12-15 DIAGNOSIS — E669 Obesity, unspecified: Secondary | ICD-10-CM

## 2017-12-15 DIAGNOSIS — J45909 Unspecified asthma, uncomplicated: Secondary | ICD-10-CM | POA: Insufficient documentation

## 2017-12-15 DIAGNOSIS — Z6839 Body mass index (BMI) 39.0-39.9, adult: Secondary | ICD-10-CM | POA: Insufficient documentation

## 2017-12-15 HISTORY — DX: Type 2 diabetes mellitus without complications: E11.9

## 2017-12-15 HISTORY — DX: Obesity, unspecified: E66.9

## 2017-12-16 ENCOUNTER — Encounter: Payer: Self-pay | Admitting: Sports Medicine

## 2017-12-16 ENCOUNTER — Ambulatory Visit (INDEPENDENT_AMBULATORY_CARE_PROVIDER_SITE_OTHER): Payer: BLUE CROSS/BLUE SHIELD | Admitting: Sports Medicine

## 2017-12-16 VITALS — BP 124/80 | HR 65 | Resp 15 | Ht 61.0 in | Wt 180.0 lb

## 2017-12-16 DIAGNOSIS — L301 Dyshidrosis [pompholyx]: Secondary | ICD-10-CM

## 2017-12-16 DIAGNOSIS — M79671 Pain in right foot: Secondary | ICD-10-CM

## 2017-12-16 DIAGNOSIS — L309 Dermatitis, unspecified: Secondary | ICD-10-CM

## 2017-12-16 MED ORDER — CLOBETASOL PROP EMOLLIENT BASE 0.05 % EX CREA: TOPICAL_CREAM | CUTANEOUS | 0 refills | Status: DC

## 2017-12-16 NOTE — Progress Notes (Signed)
Subjective: Teressa LowerVictoria Tenant Parsons is a 47 y.o. female patient who presents to office for evaluation of right foot pain. Patient complains of progressive pain especially at the right foot at the arch for the last 4 months.  Ranks pain 4/10 and is now interferring with daily activities because the area is so itchy. Patient has tried cortisone cream with no relief in symptoms. Patient denies any other pedal complaints. Denies injury/trip/fall/sprain/any causative factors.   Review of Systems  Skin: Positive for itching and rash.  All other systems reviewed and are negative.    Patient Active Problem List   Diagnosis Date Noted  . Asthma 12/15/2017  . Diabetes mellitus (HCC) 12/15/2017  . Hypertension 12/15/2017  . Obesity (BMI 30-39.9) 12/15/2017  . Primary angle-closure glaucoma 05/20/2017  . Strain of left shoulder 12/07/2016  . Acute pancreatitis 09/07/2014  . Glaucoma 07/26/2014  . Neuropathy 07/26/2014    Current Outpatient Medications on File Prior to Visit  Medication Sig Dispense Refill  . albuterol (PROAIR HFA) 108 (90 Base) MCG/ACT inhaler Inhale 2 puffs into the lungs every 4 (four) hours as needed for wheezing or shortness of breath.    Marland Kitchen. AUVI-Q 0.3 MG/0.3ML SOAJ injection Use as directed for life-threatening allergic reaction. 4 Device 3  . furosemide (LASIX) 40 MG tablet Take 40 mg by mouth as needed.     . potassium chloride (K-DUR) 10 MEQ tablet Take 10 mEq by mouth as needed.      No current facility-administered medications on file prior to visit.     Allergies  Allergen Reactions  . Beef Extract Swelling    Alpha Gal       Objective:  General: Alert and oriented x3 in no acute distress  Dermatology:There is a solitary plaque at the right medial arch with crusted over skin that is pruritic in nature with no surrounding signs of infection. No webspace macerations, no ecchymosis bilateral, all nails x 10 are well manicured.  Vascular: Dorsalis Pedis and  Posterior Tibial pedal pulses palpable, Capillary Fill Time 3 seconds,(+) pedal hair growth bilateral, no edema bilateral lower extremities, Temperature gradient within normal limits.  Neurology: Gross sensation intact via light touch bilateral.  Musculoskeletal: Mild tenderness with palpation at lesion/plaque on the right medial arch, pes planus foot type, strength within normal limits in all groups bilateral.   Assessment and Plan: Problem List Items Addressed This Visit    None    Visit Diagnoses    Vesicular palmoplantar eczema of foot    -  Primary   Relevant Medications   Clobetasol Prop Emollient Base 0.05 % emollient cream   Right foot pain          -Complete examination performed -Discussed treatement options for eczema versus tinea -Rx clobetasol and advised patient use of the skin emollients for eczema over-the-counter -Recommend good hygiene habits -Patient to return to office in 6 weeks for medication check or sooner if condition worsens.  Asencion Islamitorya [place] Vandergriff, DPM

## 2017-12-16 NOTE — Progress Notes (Signed)
   Subjective:    Patient ID: Dorothy Morgan, female    DOB: 1970-06-25, 47 y.o.   MRN: [id number]  HPI    Review of Systems  All other systems reviewed and are negative.      Objective:   Physical Exam        Assessment & Plan:

## 2018-01-24 ENCOUNTER — Ambulatory Visit: Payer: BLUE CROSS/BLUE SHIELD | Admitting: Allergy

## 2018-01-27 ENCOUNTER — Encounter: Payer: Self-pay | Admitting: Sports Medicine

## 2018-01-27 ENCOUNTER — Ambulatory Visit (INDEPENDENT_AMBULATORY_CARE_PROVIDER_SITE_OTHER): Payer: BLUE CROSS/BLUE SHIELD | Admitting: Sports Medicine

## 2018-01-27 DIAGNOSIS — L301 Dyshidrosis [pompholyx]: Secondary | ICD-10-CM

## 2018-01-27 DIAGNOSIS — L309 Dermatitis, unspecified: Secondary | ICD-10-CM

## 2018-01-27 DIAGNOSIS — R238 Other skin changes: Secondary | ICD-10-CM | POA: Diagnosis not present

## 2018-01-27 DIAGNOSIS — M79671 Pain in right foot: Secondary | ICD-10-CM | POA: Diagnosis not present

## 2018-01-27 DIAGNOSIS — L858 Other specified epidermal thickening: Secondary | ICD-10-CM | POA: Diagnosis not present

## 2018-01-27 DIAGNOSIS — R234 Changes in skin texture: Secondary | ICD-10-CM | POA: Diagnosis not present
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
## 2018-01-29 DIAGNOSIS — J029 Acute pharyngitis, unspecified: Secondary | ICD-10-CM | POA: Diagnosis not present

## 2018-01-29 DIAGNOSIS — J310 Chronic rhinitis: Secondary | ICD-10-CM | POA: Diagnosis not present

## 2018-01-29 DIAGNOSIS — Z87891 Personal history of nicotine dependence: Secondary | ICD-10-CM | POA: Diagnosis not present

## 2018-01-29 DIAGNOSIS — I10 Essential (primary) hypertension: Secondary | ICD-10-CM | POA: Diagnosis not present

## 2018-01-29 DIAGNOSIS — H938X2 Other specified disorders of left ear: Secondary | ICD-10-CM | POA: Diagnosis not present

## 2018-01-29 DIAGNOSIS — E119 Type 2 diabetes mellitus without complications: Secondary | ICD-10-CM | POA: Diagnosis not present

## 2018-02-17 ENCOUNTER — Ambulatory Visit (INDEPENDENT_AMBULATORY_CARE_PROVIDER_SITE_OTHER): Payer: BLUE CROSS/BLUE SHIELD | Admitting: Family Medicine

## 2018-02-17 ENCOUNTER — Encounter: Payer: Self-pay | Admitting: Family Medicine

## 2018-02-17 VITALS — BP 138/88 | HR 80 | Resp 18

## 2018-02-17 DIAGNOSIS — J301 Allergic rhinitis due to pollen: Secondary | ICD-10-CM

## 2018-02-17 DIAGNOSIS — J4541 Moderate persistent asthma with (acute) exacerbation: Secondary | ICD-10-CM

## 2018-02-17 DIAGNOSIS — T783XXD Angioneurotic edema, subsequent encounter: Secondary | ICD-10-CM

## 2018-02-17 DIAGNOSIS — T783XXA Angioneurotic edema, initial encounter: Secondary | ICD-10-CM | POA: Insufficient documentation

## 2018-02-17 DIAGNOSIS — J45909 Unspecified asthma, uncomplicated: Secondary | ICD-10-CM | POA: Diagnosis not present

## 2018-02-17 HISTORY — DX: Allergic rhinitis due to pollen: J30.1

## 2018-02-17 HISTORY — DX: Moderate persistent asthma with (acute) exacerbation: J45.41

## 2018-02-17 MED ORDER — MONTELUKAST SODIUM 10 MG PO TABS: ORAL_TABLET | ORAL | 5 refills | Status: DC

## 2018-02-17 MED ORDER — FAMOTIDINE 20 MG PO TABS: ORAL_TABLET | ORAL | 5 refills | Status: DC

## 2018-02-17 MED ORDER — LORATADINE 10 MG PO TABS: ORAL_TABLET | ORAL | 5 refills | Status: DC

## 2018-02-17 MED ORDER — FLUTICASONE PROPIONATE HFA 110 MCG/ACT IN AERO: INHALATION_SPRAY | RESPIRATORY_TRACT | 5 refills | Status: DC

## 2018-02-17 NOTE — Patient Instructions (Addendum)
  1.  Avoid mammalian meats.  2.  Eliminate all indoor / car tobacco smoke exposure   3.  Use preventative medication plan (will start to wean as follows):   A.  Loratadine 10 mg twice a day  B.  Famotidine 20 mg twice a day  C.  Montelukast 10 mg once a day  Weaning schedule - take away evening dose of  famotidine x 1 week.  If no reaction/swelling then take away morning dose of famotadine x 1 week.  If no reaction/swelling then take away evening dose of loratadine and continue loratidine and montelukast daily.    If at any point in the wean you develop reaction/swelling then add back in the medication dose last weaned.    4. Auvi-Q 0.3, Benadryl, MD/ER evaluation for allergic reaction  5. For nasal congestion/drainage use nasacort 2 sprays each nostril daily.  Use for 1-2 weeks at time before stopping once symptoms improve.   6. For your breathing:   A. ProAir 2 puffs every 4 hours as needed for cough or wheeze. You may use ProAir 5-15 minutes before activity to decrease cough or wheeze.   B. Prednisone 10 mg tablets. Take 2 tablets once a day for 4 days, then take 1 tablet on the 5th day and stop.   C. Montelukast 10 mg once a day as above  D. Begin Flovent 110- 2 puffs twice a day with a spacer to prevent cough or wheeze  7. Return in 3 months or earlier if problem

## 2018-02-17 NOTE — Progress Notes (Addendum)
[address] [place] [address] Dept: [phone]  FOLLOW UP NOTE  Patient ID: Dorothy Morgan, female    DOB: October 16, 1970  Age: 47 y.o. MRN: [id number] Date of Office Visit: 02/17/2018  Assessment  Chief Complaint: Oral Swelling (Since Sunday )  HPI Dorothy Morgan is a 47 year old female who presents to the clinic for an evaluation of lip swelling. She was last seen in this clinic on 08/23/2017 by Dr. Delorse Lek for evaluation of angioedema which was noted to be well controlled. At that time, she began to wean off the antihistamine and leukotrienes. At today's visit, she reports she began to experience swelling on the right side of her lip on Sunday. Over the course of the next 5 days the swelling waxed and waned moving to her right eye, right face, and top lip. She reports one day of hives that appeared under her jaw. She reports shortness of breath over the last week which has worsened over the last few days with no wheezing or cough. She has a history of childhood asthma and has an albuterol inhaler that she has not used for several years. She denies any new foods, medications, soaps, or perfumes. She denies abdominal pain, fever, recent illness, and joint or muscle pain. She reports she has not consumed any red meat in over 2 weeks and she did have a pork chop sandwich about 2 weeks ago. Her current medications are listed in the chart.   Drug Allergies:  Allergies  Allergen Reactions  . Beef Extract Swelling and Other (See Comments)    Alpha Gal    Angioedema  . Galactose Other (See Comments)    Angioedema    Physical Exam: BP 138/88   Pulse 80   Resp 18    Physical Exam  Constitutional: She appears well-developed and well-nourished.  HENT:  Head: Normocephalic.  Right Ear: External ear normal.  Left Ear: External ear normal.  Bilateral nares edematous and pale with clear nasal drainage noted. Pharynx erythematous with cobblestone appearance. Ears normal.  Eyes with slightly injected conjunctivae and no drainage noted.  Eyes: EOM are normal.  Neck: Normal range of motion. Neck supple.  Cardiovascular: Normal rate, regular rhythm and normal heart sounds.  No murmur heard. Pulmonary/Chest: Effort normal and breath sounds normal.  Lungs clear to auscultation  Musculoskeletal: Normal range of motion.  Neurological: She is alert.  Skin: Skin is warm and dry.  Swelling of upper lip. No hives noted on exam. No other areas of swelling noted.  Psychiatric: She has a normal mood and affect. Her behavior is normal. Judgment and thought content normal.    Diagnostics:  FVC 1.71, FEV1 1.42. Predicted FVC 2.7, predicted FEV1 1.99. Spirometry indicates mild restriction. Post bronchodilator therapy FVC 2.02, FEV1 1.60. Post bronchodilator therapy indicates normal ventilatory function with 13% increase in FEV1 and 18% increase in FVC.   Assessment and Plan: 1. Angioedema, subsequent encounter   2. Seasonal allergic rhinitis due to pollen   3. Moderate persistent asthma with acute exacerbation     Meds ordered this encounter  Medications  . loratadine (CLARITIN) 10 MG tablet    Sig: Take 1 tablet by mouth twice daily    Dispense:  60 tablet    Refill:  5  . famotidine (PEPCID) 20 MG tablet    Sig: Take 1 tablet by mouth twice daily    Dispense:  60 tablet    Refill:  5  . montelukast (SINGULAIR) 10 MG  tablet    Sig: Take 1 tablet by mouth daily    Dispense:  30 tablet    Refill:  5  . fluticasone (FLOVENT HFA) 110 MCG/ACT inhaler    Sig: Inhale 2 puffs two times daily with spacer to prevent cough or wheeze. Rinse mouth out after use.    Dispense:  1 Inhaler    Refill:  5    1.  Avoid mammalian meats.  2.  Eliminate all indoor / car tobacco smoke exposure   3.  Use preventative medication plan (will start to wean as follows):   A.  Loratadine 10 mg twice a day  B.  Famotidine 20 mg twice a day  C.  Montelukast 10 mg once a  day  Weaning schedule - take away evening dose of  famotidine x 1 week.  If no reaction/swelling then take away morning dose of famotadine x 1 week.  If no reaction/swelling then take away evening dose of loratadine and continue loratidine and montelukast daily.    If at any point in the wean you develop reaction/swelling then add back in the medication dose last weaned.    4. Auvi-Q 0.3, Benadryl, MD/ER evaluation for allergic reaction  5. For nasal congestion/drainage use nasacort 2 sprays each nostril daily.  Use for 1-2 weeks at time before stopping once symptoms improve.   6. For your breathing:    A. ProAir 2 puffs every 4 hours as needed for cough or wheeze. You may use ProAir 5-15 minutes before activity to decrease cough or wheeze.   B. Prednisone 10 mg tablets. Take 2 tablets once a day for 4 days, then take 1 tablet on the 5th day and stop.   C. Montelukast 10 mg once a day as above  D. Begin Flovent 110- 2 puffs twice a day with a spacer to prevent cough or wheeze  7. Return in 3 months or earlier if problem   Return in about 3 months (around 05/20/2018), or if symptoms worsen or fail to improve.   Dorothy Morgan presents to the clinic with angioedema in addition to an asthma flare today. She will restart her antiinflammatory regimen with the addition of an inhaler corticosteroid. I will see her back in this clinic in 3 months or sooner if needed.   Thank you for the opportunity to care for this patient.  Please do not hesitate to contact me with questions.  Thermon Leyland, FNP [facility]  I have provided oversight concerning Thermon Leyland' evaluation and treatment of this patient's health issues addressed during today's encounter. I agree with the assessment and therapeutic plan as outlined in the note.   Signed,   Jessica Priest, MD,  Allergy and Immunology,  [facility] [place].

## 2018-02-18 ENCOUNTER — Encounter: Payer: Self-pay | Admitting: Family Medicine

## 2018-04-08 DIAGNOSIS — I10 Essential (primary) hypertension: Secondary | ICD-10-CM | POA: Diagnosis not present

## 2018-04-08 DIAGNOSIS — R079 Chest pain, unspecified: Secondary | ICD-10-CM | POA: Diagnosis not present

## 2018-04-08 DIAGNOSIS — M79602 Pain in left arm: Secondary | ICD-10-CM | POA: Diagnosis not present

## 2018-04-08 DIAGNOSIS — R61 Generalized hyperhidrosis: Secondary | ICD-10-CM | POA: Diagnosis not present

## 2018-04-08 DIAGNOSIS — E1140 Type 2 diabetes mellitus with diabetic neuropathy, unspecified: Secondary | ICD-10-CM | POA: Diagnosis not present

## 2018-04-08 DIAGNOSIS — R0602 Shortness of breath: Secondary | ICD-10-CM | POA: Diagnosis not present

## 2018-04-08 DIAGNOSIS — M545 Low back pain: Secondary | ICD-10-CM | POA: Diagnosis not present

## 2018-04-08 DIAGNOSIS — Z87891 Personal history of nicotine dependence: Secondary | ICD-10-CM | POA: Diagnosis not present

## 2018-04-08 DIAGNOSIS — R0789 Other chest pain: Secondary | ICD-10-CM | POA: Diagnosis not present

## 2018-04-08 DIAGNOSIS — J45909 Unspecified asthma, uncomplicated: Secondary | ICD-10-CM | POA: Diagnosis not present

## 2018-05-01 DIAGNOSIS — M79602 Pain in left arm: Secondary | ICD-10-CM | POA: Diagnosis not present

## 2018-05-01 DIAGNOSIS — M542 Cervicalgia: Secondary | ICD-10-CM | POA: Diagnosis not present

## 2018-05-10 DIAGNOSIS — R87612 Low grade squamous intraepithelial lesion on cytologic smear of cervix (LGSIL): Secondary | ICD-10-CM | POA: Diagnosis not present

## 2018-05-10 DIAGNOSIS — R87613 High grade squamous intraepithelial lesion on cytologic smear of cervix (HGSIL): Secondary | ICD-10-CM | POA: Diagnosis not present

## 2018-05-10 DIAGNOSIS — N912 Amenorrhea, unspecified: Secondary | ICD-10-CM | POA: Diagnosis not present

## 2018-05-19 DIAGNOSIS — Z1231 Encounter for screening mammogram for malignant neoplasm of breast: Secondary | ICD-10-CM | POA: Diagnosis not present

## 2018-05-22 DIAGNOSIS — Z0189 Encounter for other specified special examinations: Secondary | ICD-10-CM | POA: Diagnosis not present

## 2018-05-29 DIAGNOSIS — M25512 Pain in left shoulder: Secondary | ICD-10-CM | POA: Diagnosis not present

## 2018-05-29 DIAGNOSIS — J45909 Unspecified asthma, uncomplicated: Secondary | ICD-10-CM | POA: Diagnosis not present

## 2018-06-06 DIAGNOSIS — H4020X3 Unspecified primary angle-closure glaucoma, severe stage: Secondary | ICD-10-CM | POA: Diagnosis not present

## 2018-06-06 DIAGNOSIS — H4020X1 Unspecified primary angle-closure glaucoma, mild stage: Secondary | ICD-10-CM | POA: Diagnosis not present

## 2018-06-06 DIAGNOSIS — H04123 Dry eye syndrome of bilateral lacrimal glands: Secondary | ICD-10-CM | POA: Diagnosis not present

## 2018-06-28 DIAGNOSIS — H52223 Regular astigmatism, bilateral: Secondary | ICD-10-CM | POA: Diagnosis not present

## 2018-07-05 DIAGNOSIS — Z981 Arthrodesis status: Secondary | ICD-10-CM | POA: Diagnosis not present

## 2018-07-05 DIAGNOSIS — M4040 Postural lordosis, site unspecified: Secondary | ICD-10-CM | POA: Diagnosis not present

## 2018-07-05 DIAGNOSIS — M542 Cervicalgia: Secondary | ICD-10-CM | POA: Diagnosis not present

## 2018-07-05 DIAGNOSIS — M47812 Spondylosis without myelopathy or radiculopathy, cervical region: Secondary | ICD-10-CM | POA: Diagnosis not present

## 2018-07-20 DIAGNOSIS — J019 Acute sinusitis, unspecified: Secondary | ICD-10-CM | POA: Diagnosis not present

## 2018-07-20 DIAGNOSIS — R509 Fever, unspecified: Secondary | ICD-10-CM | POA: Diagnosis not present

## 2018-07-22 DIAGNOSIS — R062 Wheezing: Secondary | ICD-10-CM | POA: Diagnosis not present

## 2018-07-22 DIAGNOSIS — R05 Cough: Secondary | ICD-10-CM | POA: Diagnosis not present

## 2018-07-22 DIAGNOSIS — R509 Fever, unspecified: Secondary | ICD-10-CM | POA: Diagnosis not present

## 2018-07-24 DIAGNOSIS — J45909 Unspecified asthma, uncomplicated: Secondary | ICD-10-CM | POA: Diagnosis not present

## 2018-07-24 DIAGNOSIS — J329 Chronic sinusitis, unspecified: Secondary | ICD-10-CM | POA: Diagnosis not present

## 2018-08-10 DIAGNOSIS — N938 Other specified abnormal uterine and vaginal bleeding: Secondary | ICD-10-CM | POA: Diagnosis not present

## 2018-08-25 DIAGNOSIS — N920 Excessive and frequent menstruation with regular cycle: Secondary | ICD-10-CM | POA: Diagnosis not present

## 2018-08-25 DIAGNOSIS — N921 Excessive and frequent menstruation with irregular cycle: Secondary | ICD-10-CM | POA: Diagnosis not present

## 2018-08-25 DIAGNOSIS — N938 Other specified abnormal uterine and vaginal bleeding: Secondary | ICD-10-CM | POA: Diagnosis not present

## 2018-08-29 DIAGNOSIS — M25532 Pain in left wrist: Secondary | ICD-10-CM | POA: Diagnosis not present

## 2018-08-29 DIAGNOSIS — J45909 Unspecified asthma, uncomplicated: Secondary | ICD-10-CM | POA: Diagnosis not present

## 2018-08-29 DIAGNOSIS — M62838 Other muscle spasm: Secondary | ICD-10-CM | POA: Diagnosis not present

## 2018-10-06 DIAGNOSIS — H401111 Primary open-angle glaucoma, right eye, mild stage: Secondary | ICD-10-CM | POA: Diagnosis not present

## 2018-10-06 DIAGNOSIS — H401123 Primary open-angle glaucoma, left eye, severe stage: Secondary | ICD-10-CM | POA: Diagnosis not present

## 2018-10-06 DIAGNOSIS — H04123 Dry eye syndrome of bilateral lacrimal glands: Secondary | ICD-10-CM | POA: Diagnosis not present

## 2018-10-09 DIAGNOSIS — M542 Cervicalgia: Secondary | ICD-10-CM | POA: Diagnosis not present

## 2018-10-09 DIAGNOSIS — J45909 Unspecified asthma, uncomplicated: Secondary | ICD-10-CM | POA: Diagnosis not present

## 2018-10-09 DIAGNOSIS — M79602 Pain in left arm: Secondary | ICD-10-CM | POA: Diagnosis not present

## 2018-10-16 DIAGNOSIS — M50221 Other cervical disc displacement at C4-C5 level: Secondary | ICD-10-CM | POA: Diagnosis not present

## 2018-10-16 DIAGNOSIS — M542 Cervicalgia: Secondary | ICD-10-CM | POA: Diagnosis not present

## 2018-10-16 DIAGNOSIS — M50323 Other cervical disc degeneration at C6-C7 level: Secondary | ICD-10-CM | POA: Diagnosis not present

## 2018-10-18 DIAGNOSIS — M542 Cervicalgia: Secondary | ICD-10-CM | POA: Diagnosis not present

## 2018-10-20 DIAGNOSIS — M79602 Pain in left arm: Secondary | ICD-10-CM | POA: Diagnosis not present

## 2018-10-20 DIAGNOSIS — M542 Cervicalgia: Secondary | ICD-10-CM | POA: Diagnosis not present

## 2018-10-30 DIAGNOSIS — M5412 Radiculopathy, cervical region: Secondary | ICD-10-CM | POA: Diagnosis not present

## 2018-10-30 DIAGNOSIS — J45909 Unspecified asthma, uncomplicated: Secondary | ICD-10-CM | POA: Diagnosis not present

## 2018-11-27 DIAGNOSIS — Z79899 Other long term (current) drug therapy: Secondary | ICD-10-CM | POA: Diagnosis not present

## 2018-11-27 DIAGNOSIS — Z5181 Encounter for therapeutic drug level monitoring: Secondary | ICD-10-CM | POA: Diagnosis not present

## 2018-11-27 DIAGNOSIS — J45909 Unspecified asthma, uncomplicated: Secondary | ICD-10-CM | POA: Diagnosis not present

## 2018-11-27 DIAGNOSIS — M5412 Radiculopathy, cervical region: Secondary | ICD-10-CM | POA: Diagnosis not present

## 2018-12-06 DIAGNOSIS — M4322 Fusion of spine, cervical region: Secondary | ICD-10-CM | POA: Diagnosis not present

## 2018-12-06 DIAGNOSIS — M7552 Bursitis of left shoulder: Secondary | ICD-10-CM | POA: Diagnosis not present

## 2018-12-27 DIAGNOSIS — M75122 Complete rotator cuff tear or rupture of left shoulder, not specified as traumatic: Secondary | ICD-10-CM | POA: Diagnosis not present

## 2018-12-27 DIAGNOSIS — M7552 Bursitis of left shoulder: Secondary | ICD-10-CM | POA: Diagnosis not present

## 2018-12-27 DIAGNOSIS — M75102 Unspecified rotator cuff tear or rupture of left shoulder, not specified as traumatic: Secondary | ICD-10-CM | POA: Diagnosis not present

## 2018-12-29 DIAGNOSIS — M25512 Pain in left shoulder: Secondary | ICD-10-CM | POA: Diagnosis not present

## 2018-12-29 DIAGNOSIS — M5412 Radiculopathy, cervical region: Secondary | ICD-10-CM | POA: Diagnosis not present

## 2018-12-29 DIAGNOSIS — Z5181 Encounter for therapeutic drug level monitoring: Secondary | ICD-10-CM | POA: Diagnosis not present

## 2018-12-29 DIAGNOSIS — Z79899 Other long term (current) drug therapy: Secondary | ICD-10-CM | POA: Diagnosis not present

## 2018-12-29 DIAGNOSIS — J45909 Unspecified asthma, uncomplicated: Secondary | ICD-10-CM | POA: Diagnosis not present

## 2019-01-11 DIAGNOSIS — M4322 Fusion of spine, cervical region: Secondary | ICD-10-CM | POA: Diagnosis not present

## 2019-01-24 DIAGNOSIS — R87612 Low grade squamous intraepithelial lesion on cytologic smear of cervix (LGSIL): Secondary | ICD-10-CM | POA: Diagnosis not present

## 2019-01-29 DIAGNOSIS — Z5181 Encounter for therapeutic drug level monitoring: Secondary | ICD-10-CM | POA: Diagnosis not present

## 2019-01-29 DIAGNOSIS — J45909 Unspecified asthma, uncomplicated: Secondary | ICD-10-CM | POA: Diagnosis not present

## 2019-01-29 DIAGNOSIS — M5412 Radiculopathy, cervical region: Secondary | ICD-10-CM | POA: Diagnosis not present

## 2019-01-29 DIAGNOSIS — Z79899 Other long term (current) drug therapy: Secondary | ICD-10-CM | POA: Diagnosis not present

## 2019-02-05 DIAGNOSIS — Z6837 Body mass index (BMI) 37.0-37.9, adult: Secondary | ICD-10-CM | POA: Diagnosis not present

## 2019-02-05 DIAGNOSIS — M542 Cervicalgia: Secondary | ICD-10-CM | POA: Diagnosis not present

## 2019-02-05 DIAGNOSIS — M62838 Other muscle spasm: Secondary | ICD-10-CM | POA: Diagnosis not present

## 2019-02-05 DIAGNOSIS — Z981 Arthrodesis status: Secondary | ICD-10-CM | POA: Diagnosis not present

## 2019-02-19 DIAGNOSIS — R87612 Low grade squamous intraepithelial lesion on cytologic smear of cervix (LGSIL): Secondary | ICD-10-CM | POA: Diagnosis not present

## 2019-03-02 DIAGNOSIS — Z5181 Encounter for therapeutic drug level monitoring: Secondary | ICD-10-CM | POA: Diagnosis not present

## 2019-03-02 DIAGNOSIS — Z79899 Other long term (current) drug therapy: Secondary | ICD-10-CM | POA: Diagnosis not present

## 2019-03-02 DIAGNOSIS — M50122 Cervical disc disorder at C5-C6 level with radiculopathy: Secondary | ICD-10-CM | POA: Diagnosis not present

## 2019-03-02 DIAGNOSIS — M50123 Cervical disc disorder at C6-C7 level with radiculopathy: Secondary | ICD-10-CM | POA: Diagnosis not present

## 2019-03-02 DIAGNOSIS — G8929 Other chronic pain: Secondary | ICD-10-CM | POA: Diagnosis not present

## 2019-03-02 DIAGNOSIS — M50021 Cervical disc disorder at C4-C5 level with myelopathy: Secondary | ICD-10-CM | POA: Diagnosis not present

## 2019-03-02 DIAGNOSIS — M4322 Fusion of spine, cervical region: Secondary | ICD-10-CM | POA: Diagnosis not present

## 2019-03-06 DIAGNOSIS — Z01812 Encounter for preprocedural laboratory examination: Secondary | ICD-10-CM | POA: Diagnosis not present

## 2019-03-06 DIAGNOSIS — Z20828 Contact with and (suspected) exposure to other viral communicable diseases: Secondary | ICD-10-CM | POA: Diagnosis not present

## 2019-03-06 DIAGNOSIS — M50123 Cervical disc disorder at C6-C7 level with radiculopathy: Secondary | ICD-10-CM | POA: Diagnosis not present

## 2019-03-13 DIAGNOSIS — M50123 Cervical disc disorder at C6-C7 level with radiculopathy: Secondary | ICD-10-CM | POA: Diagnosis not present

## 2019-03-13 DIAGNOSIS — M4712 Other spondylosis with myelopathy, cervical region: Secondary | ICD-10-CM | POA: Diagnosis not present

## 2019-03-13 DIAGNOSIS — Z7409 Other reduced mobility: Secondary | ICD-10-CM | POA: Diagnosis not present

## 2019-03-13 DIAGNOSIS — M50021 Cervical disc disorder at C4-C5 level with myelopathy: Secondary | ICD-10-CM

## 2019-03-13 DIAGNOSIS — M4322 Fusion of spine, cervical region: Secondary | ICD-10-CM | POA: Diagnosis not present

## 2019-03-13 DIAGNOSIS — M5001 Cervical disc disorder with myelopathy,  high cervical region: Secondary | ICD-10-CM | POA: Diagnosis not present

## 2019-03-13 DIAGNOSIS — M50122 Cervical disc disorder at C5-C6 level with radiculopathy: Secondary | ICD-10-CM | POA: Diagnosis not present

## 2019-03-13 HISTORY — DX: Cervical disc disorder at C4-C5 level with myelopathy: M50.021

## 2019-03-14 DIAGNOSIS — Z7409 Other reduced mobility: Secondary | ICD-10-CM | POA: Diagnosis not present

## 2019-03-14 DIAGNOSIS — M4322 Fusion of spine, cervical region: Secondary | ICD-10-CM | POA: Diagnosis not present

## 2019-03-14 DIAGNOSIS — M50021 Cervical disc disorder at C4-C5 level with myelopathy: Secondary | ICD-10-CM | POA: Diagnosis not present

## 2019-03-14 DIAGNOSIS — M50123 Cervical disc disorder at C6-C7 level with radiculopathy: Secondary | ICD-10-CM | POA: Diagnosis not present

## 2019-04-10 DIAGNOSIS — M4322 Fusion of spine, cervical region: Secondary | ICD-10-CM | POA: Diagnosis not present

## 2019-05-28 DIAGNOSIS — R7301 Impaired fasting glucose: Secondary | ICD-10-CM | POA: Diagnosis not present

## 2019-05-28 DIAGNOSIS — N951 Menopausal and female climacteric states: Secondary | ICD-10-CM | POA: Diagnosis not present

## 2019-05-28 DIAGNOSIS — R635 Abnormal weight gain: Secondary | ICD-10-CM | POA: Diagnosis not present

## 2019-05-28 DIAGNOSIS — E782 Mixed hyperlipidemia: Secondary | ICD-10-CM | POA: Diagnosis not present

## 2019-05-28 DIAGNOSIS — E039 Hypothyroidism, unspecified: Secondary | ICD-10-CM | POA: Diagnosis not present

## 2019-05-28 DIAGNOSIS — E559 Vitamin D deficiency, unspecified: Secondary | ICD-10-CM | POA: Diagnosis not present

## 2019-05-30 DIAGNOSIS — N951 Menopausal and female climacteric states: Secondary | ICD-10-CM | POA: Diagnosis not present

## 2019-05-30 DIAGNOSIS — Z1339 Encounter for screening examination for other mental health and behavioral disorders: Secondary | ICD-10-CM | POA: Diagnosis not present

## 2019-05-30 DIAGNOSIS — E559 Vitamin D deficiency, unspecified: Secondary | ICD-10-CM | POA: Diagnosis not present

## 2019-05-30 DIAGNOSIS — R7303 Prediabetes: Secondary | ICD-10-CM | POA: Diagnosis not present

## 2019-05-30 DIAGNOSIS — Z1331 Encounter for screening for depression: Secondary | ICD-10-CM | POA: Diagnosis not present

## 2019-05-30 DIAGNOSIS — E782 Mixed hyperlipidemia: Secondary | ICD-10-CM | POA: Diagnosis not present

## 2019-06-04 DIAGNOSIS — Z6836 Body mass index (BMI) 36.0-36.9, adult: Secondary | ICD-10-CM | POA: Diagnosis not present

## 2019-06-04 DIAGNOSIS — M542 Cervicalgia: Secondary | ICD-10-CM | POA: Diagnosis not present

## 2019-06-04 DIAGNOSIS — R945 Abnormal results of liver function studies: Secondary | ICD-10-CM | POA: Diagnosis not present

## 2019-06-05 DIAGNOSIS — R945 Abnormal results of liver function studies: Secondary | ICD-10-CM | POA: Diagnosis not present

## 2019-06-06 DIAGNOSIS — I10 Essential (primary) hypertension: Secondary | ICD-10-CM | POA: Diagnosis not present

## 2019-06-06 DIAGNOSIS — M4322 Fusion of spine, cervical region: Secondary | ICD-10-CM | POA: Diagnosis not present

## 2019-06-06 DIAGNOSIS — Z6837 Body mass index (BMI) 37.0-37.9, adult: Secondary | ICD-10-CM | POA: Diagnosis not present

## 2019-07-02 DIAGNOSIS — M4322 Fusion of spine, cervical region: Secondary | ICD-10-CM | POA: Diagnosis not present

## 2019-07-02 DIAGNOSIS — M7918 Myalgia, other site: Secondary | ICD-10-CM | POA: Diagnosis not present

## 2019-07-04 DIAGNOSIS — R945 Abnormal results of liver function studies: Secondary | ICD-10-CM | POA: Diagnosis not present

## 2019-07-04 DIAGNOSIS — I10 Essential (primary) hypertension: Secondary | ICD-10-CM | POA: Diagnosis not present

## 2019-07-04 DIAGNOSIS — M5412 Radiculopathy, cervical region: Secondary | ICD-10-CM | POA: Diagnosis not present

## 2019-07-04 DIAGNOSIS — J45909 Unspecified asthma, uncomplicated: Secondary | ICD-10-CM | POA: Diagnosis not present

## 2019-07-13 DIAGNOSIS — K7689 Other specified diseases of liver: Secondary | ICD-10-CM | POA: Diagnosis not present

## 2019-07-13 DIAGNOSIS — R945 Abnormal results of liver function studies: Secondary | ICD-10-CM | POA: Diagnosis not present

## 2019-07-25 DIAGNOSIS — Z1231 Encounter for screening mammogram for malignant neoplasm of breast: Secondary | ICD-10-CM | POA: Diagnosis not present

## 2019-07-31 DIAGNOSIS — Z6837 Body mass index (BMI) 37.0-37.9, adult: Secondary | ICD-10-CM | POA: Diagnosis not present

## 2019-07-31 DIAGNOSIS — M542 Cervicalgia: Secondary | ICD-10-CM | POA: Diagnosis not present

## 2019-08-16 DIAGNOSIS — R079 Chest pain, unspecified: Secondary | ICD-10-CM | POA: Diagnosis not present

## 2019-09-03 DIAGNOSIS — R945 Abnormal results of liver function studies: Secondary | ICD-10-CM | POA: Diagnosis not present

## 2019-09-03 DIAGNOSIS — I10 Essential (primary) hypertension: Secondary | ICD-10-CM | POA: Diagnosis not present

## 2019-09-03 DIAGNOSIS — M542 Cervicalgia: Secondary | ICD-10-CM | POA: Diagnosis not present

## 2019-09-03 DIAGNOSIS — Z6836 Body mass index (BMI) 36.0-36.9, adult: Secondary | ICD-10-CM | POA: Diagnosis not present

## 2020-07-11 ENCOUNTER — Encounter: Payer: Self-pay | Admitting: Cardiology

## 2020-07-11 DIAGNOSIS — M542 Cervicalgia: Secondary | ICD-10-CM

## 2020-07-11 DIAGNOSIS — R7303 Prediabetes: Secondary | ICD-10-CM | POA: Insufficient documentation

## 2020-07-11 DIAGNOSIS — R600 Localized edema: Secondary | ICD-10-CM | POA: Insufficient documentation

## 2020-07-11 DIAGNOSIS — I10 Essential (primary) hypertension: Secondary | ICD-10-CM | POA: Insufficient documentation

## 2020-07-11 DIAGNOSIS — K760 Fatty (change of) liver, not elsewhere classified: Secondary | ICD-10-CM

## 2020-07-11 DIAGNOSIS — E785 Hyperlipidemia, unspecified: Secondary | ICD-10-CM | POA: Insufficient documentation

## 2020-07-11 DIAGNOSIS — E559 Vitamin D deficiency, unspecified: Secondary | ICD-10-CM | POA: Insufficient documentation

## 2020-07-11 DIAGNOSIS — J45909 Unspecified asthma, uncomplicated: Secondary | ICD-10-CM | POA: Insufficient documentation

## 2020-07-11 DIAGNOSIS — E782 Mixed hyperlipidemia: Secondary | ICD-10-CM | POA: Insufficient documentation

## 2020-07-21 ENCOUNTER — Encounter: Payer: Self-pay | Admitting: Family Medicine

## 2020-07-21 ENCOUNTER — Other Ambulatory Visit: Payer: Self-pay

## 2020-07-22 ENCOUNTER — Other Ambulatory Visit: Payer: Self-pay

## 2020-07-22 ENCOUNTER — Encounter: Payer: Self-pay | Admitting: Cardiology

## 2020-07-22 ENCOUNTER — Ambulatory Visit (INDEPENDENT_AMBULATORY_CARE_PROVIDER_SITE_OTHER): Payer: BLUE CROSS/BLUE SHIELD | Admitting: Cardiology

## 2020-07-22 VITALS — BP 136/78 | HR 88 | Ht 59.0 in | Wt 205.0 lb

## 2020-07-22 DIAGNOSIS — J4540 Moderate persistent asthma, uncomplicated: Secondary | ICD-10-CM

## 2020-07-22 DIAGNOSIS — R0789 Other chest pain: Secondary | ICD-10-CM

## 2020-07-22 DIAGNOSIS — E782 Mixed hyperlipidemia: Secondary | ICD-10-CM

## 2020-07-22 DIAGNOSIS — E119 Type 2 diabetes mellitus without complications: Secondary | ICD-10-CM

## 2020-07-22 DIAGNOSIS — I10 Essential (primary) hypertension: Secondary | ICD-10-CM

## 2020-07-22 HISTORY — DX: Other chest pain: R07.89

## 2020-07-22 MED ORDER — ASPIRIN EC 81 MG PO TBEC: 81.0000 mg | DELAYED_RELEASE_TABLET | Freq: Every day | ORAL | 3 refills | Status: AC

## 2020-07-22 MED ORDER — ATORVASTATIN CALCIUM 10 MG PO TABS: 10.0000 mg | ORAL_TABLET | Freq: Every day | ORAL | 1 refills | Status: DC

## 2020-07-22 NOTE — Patient Instructions (Signed)
Medication Instructions:  Your physician has recommended you make the following change in your medication:  START: Lipitor 10 mg daily   START: Aspirin 81 mg daily   *If you need a refill on your cardiac medications before your next appointment, please call your pharmacy*   Lab Work: None If you have labs (blood work) drawn today and your tests are completely normal, you will receive your results only by: Marland Kitchen MyChart Message (if you have MyChart) OR . A paper copy in the mail If you have any lab test that is abnormal or we need to change your treatment, we will call you to review the results.   Testing/Procedures: Your physician has requested that you have an echocardiogram. Echocardiography is a painless test that uses sound waves to create images of your heart. It provides your doctor with information about the size and shape of your heart and how well your heart's chambers and valves are working. This procedure takes approximately one hour. There are no restrictions for this procedure.    [facility] [address] [place], [place] [address] Phone:  [phone]    Please arrive 15 minutes prior to your appointment time for registration and insurance purposes.  The test will take approximately 3 to 4 hours to complete; you may bring reading material.  If someone comes with you to your appointment, they will need to remain in the main lobby due to limited space in the testing area. **If you are pregnant or breastfeeding, please notify the nuclear lab prior to your appointment**  How to prepare for your Myocardial Perfusion Test: . Do not eat or drink 3 hours prior to your test, except you may have water. . Do not consume products containing caffeine (regular or decaffeinated) 12 hours prior to your test. (ex: coffee, chocolate, sodas, tea). . Do bring a list of your current medications with you.  If not listed below, you may take your medications as  normal.  . Do wear comfortable clothes (no dresses or overalls) and walking shoes, tennis shoes preferred (No heels or open toe shoes are allowed). . Do NOT wear cologne, perfume, aftershave, or lotions (deodorant is allowed). . If these instructions are not followed, your test will have to be rescheduled.  Please report to [address] for your test.  If you have questions or concerns about your appointment, you can call the [facility] at [phone].  If you cannot keep your appointment, please provide 24 hours notification to the Nuclear Lab, to avoid a possible $50 charge to your account.    Follow-Up: At [facility], you and your health needs are our priority.  As part of our continuing mission to provide you with exceptional heart care, we have created designated Provider Care Teams.  These Care Teams include your primary Cardiologist (physician) and Advanced Practice Providers (APPs -  Physician Assistants and Nurse Practitioners) who all work together to provide you with the care you need, when you need it.  We recommend signing up for the patient portal called "MyChart".  Sign up information is provided on this After Visit Summary.  MyChart is used to connect with patients for Virtual Visits (Telemedicine).  Patients are able to view lab/test results, encounter notes, upcoming appointments, etc.  Non-urgent messages can be sent to your provider as well.   To learn more about what you can do with MyChart, go to [email or web address].    Your next appointment:  2 month(s)  The format for your next appointment:   In Person  Provider:   Gypsy Balsam, MD   Other Instructions   Echocardiogram An echocardiogram is a test that uses sound waves (ultrasound) to produce images of the heart. Images from an echocardiogram can provide important information about:  Heart size and shape.  The size and thickness and movement of your  heart's walls.  Heart muscle function and strength.  Heart valve function or if you have stenosis. Stenosis is when the heart valves are too narrow.  If blood is flowing backward through the heart valves (regurgitation).  A tumor or infectious growth around the heart valves.  Areas of heart muscle that are not working well because of poor blood flow or injury from a heart attack.  Aneurysm detection. An aneurysm is a weak or damaged part of an artery wall. The wall bulges out from the normal force of blood pumping through the body. Tell a health care provider about:  Any allergies you have.  All medicines you are taking, including vitamins, herbs, eye drops, creams, and over-the-counter medicines.  Any blood disorders you have.  Any surgeries you have had.  Any medical conditions you have.  Whether you are pregnant or may be pregnant. What are the risks? Generally, this is a safe test. However, problems may occur, including an allergic reaction to dye (contrast) that may be used during the test. What happens before the test? No specific preparation is needed. You may eat and drink normally. What happens during the test?  You will take off your clothes from the waist up and put on a hospital gown.  Electrodes or electrocardiogram (ECG)patches may be placed on your chest. The electrodes or patches are then connected to a device that monitors your heart rate and rhythm.  You will lie down on a table for an ultrasound exam. A gel will be applied to your chest to help sound waves pass through your skin.  A handheld device, called a transducer, will be pressed against your chest and moved over your heart. The transducer produces sound waves that travel to your heart and bounce back (or "echo" back) to the transducer. These sound waves will be captured in real-time and changed into images of your heart that can be viewed on a video monitor. The images will be recorded on a computer and  reviewed by your health care provider.  You may be asked to change positions or hold your breath for a short time. This makes it easier to get different views or better views of your heart.  In some cases, you may receive contrast through an IV in one of your veins. This can improve the quality of the pictures from your heart. The procedure may vary among health care providers and hospitals.   What can I expect after the test? You may return to your normal, everyday life, including diet, activities, and medicines, unless your health care provider tells you not to do that. Follow these instructions at home:  It is up to you to get the results of your test. Ask your health care provider, or the department that is doing the test, when your results will be ready.  Keep all follow-up visits. This is important. Summary  An echocardiogram is a test that uses sound waves (ultrasound) to produce images of the heart.  Images from an echocardiogram can provide important information about the size and shape of your heart, heart muscle function, heart valve  function, and other possible heart problems.  You do not need to do anything to prepare before this test. You may eat and drink normally.  After the echocardiogram is completed, you may return to your normal, everyday life, unless your health care provider tells you not to do that. This information is not intended to replace advice given to you by your health care provider. Make sure you discuss any questions you have with your health care provider. Document Revised: 12/11/2019 Document Reviewed: 12/11/2019 Elsevier Patient Education  2021 Elsevier Inc.   Cardiac Nuclear Scan A cardiac nuclear scan is a test that measures blood flow to the heart when a person is resting and when he or she is exercising. The test looks for problems such as:  Not enough blood reaching a portion of the heart.  The heart muscle not working normally. You may need  this test if:  You have heart disease.  You have had abnormal lab results.  You have had heart surgery or a balloon procedure to open up blocked arteries (angioplasty).  You have chest pain.  You have shortness of breath. In this test, a radioactive dye (tracer) is injected into your bloodstream. After the tracer has traveled to your heart, an imaging device is used to measure how much of the tracer is absorbed by or distributed to various areas of your heart. This procedure is usually done at a hospital and takes 2-4 hours. Tell a health care provider about:  Any allergies you have.  All medicines you are taking, including vitamins, herbs, eye drops, creams, and over-the-counter medicines.  Any problems you or family members have had with anesthetic medicines.  Any blood disorders you have.  Any surgeries you have had.  Any medical conditions you have.  Whether you are pregnant or may be pregnant. What are the risks? Generally, this is a safe procedure. However, problems may occur, including:  Serious chest pain and heart attack. This is only a risk if the stress portion of the test is done.  Rapid heartbeat.  Sensation of warmth in your chest. This usually passes quickly.  Allergic reaction to the tracer. What happens before the procedure?  Ask your health care provider about changing or stopping your regular medicines. This is especially important if you are taking diabetes medicines or blood thinners.  Follow instructions from your health care provider about eating or drinking restrictions.  Remove your jewelry on the day of the procedure. What happens during the procedure?  An IV will be inserted into one of your veins.  Your health care provider will inject a small amount of radioactive tracer through the IV.  You will wait for 20-40 minutes while the tracer travels through your bloodstream.  Your heart activity will be monitored with an electrocardiogram  (ECG).  You will lie down on an exam table.  Images of your heart will be taken for about 15-20 minutes.  You may also have a stress test. For this test, one of the following may be done: ? You will exercise on a treadmill or stationary bike. While you exercise, your heart's activity will be monitored with an ECG, and your blood pressure will be checked. ? You will be given medicines that will increase blood flow to parts of your heart. This is done if you are unable to exercise.  When blood flow to your heart has peaked, a tracer will again be injected through the IV.  After 20-40 minutes, you will get back  on the exam table and have more images taken of your heart.  Depending on the type of tracer used, scans may need to be repeated 3-4 hours later.  Your IV line will be removed when the procedure is over. The procedure may vary among health care providers and hospitals. What happens after the procedure?  Unless your health care provider tells you otherwise, you may return to your normal schedule, including diet, activities, and medicines.  Unless your health care provider tells you otherwise, you may increase your fluid intake. This will help to flush the contrast dye from your body. Drink enough fluid to keep your urine pale yellow.  Ask your health care provider, or the department that is doing the test: ? When will my results be ready? ? How will I get my results? Summary  A cardiac nuclear scan measures the blood flow to the heart when a person is resting and when he or she is exercising.  Tell your health care provider if you are pregnant.  Before the procedure, ask your health care provider about changing or stopping your regular medicines. This is especially important if you are taking diabetes medicines or blood thinners.  After the procedure, unless your health care provider tells you otherwise, increase your fluid intake. This will help flush the contrast dye from your  body.  After the procedure, unless your health care provider tells you otherwise, you may return to your normal schedule, including diet, activities, and medicines. This information is not intended to replace advice given to you by your health care provider. Make sure you discuss any questions you have with your health care provider. Document Revised: 10/03/2017 Document Reviewed: 10/03/2017 Elsevier Patient Education  2021 Elsevier Inc.   Aspirin and Your Heart Aspirin is a medicine that prevents the platelets in your blood from sticking together. Platelets are the cells that your blood uses for clotting. Aspirin can be used to help reduce the risk of blood clots, heart attacks, and other heart-related problems. What are the risks? Daily use of aspirin can cause side effects. Some of these include:  Bleeding. Bleeding can be minor or serious. An example of minor bleeding is bleeding from a cut, and the bleeding does not stop. An example of more serious bleeding is stomach bleeding or, rarely, bleeding into the brain. Your risk of bleeding increases if you are also taking NSAIDs, such as ibuprofen.  Increased bruising.  Upset stomach.  An allergic reaction. People who have growths inside the nose (nasal polyps) have an increased risk of developing an aspirin allergy. How to use aspirin to care for your heart  Take aspirin only as told by your health care provider. Make sure that you understand how much to take and what form to take. The two forms of aspirin are: ? Non-enteric-coated.This type of aspirin does not have a coating and is absorbed quickly. This type of aspirin also comes in a chewable form. ? Enteric-coated. This type of aspirin has a coating that releases the medicine very slowly. Enteric-coated aspirin might cause less stomach upset than non-enteric-coated aspirin. This type of aspirin should not be chewed or crushed.  Work with your health care provider to find out whether  it is safe and beneficial for you to take aspirin daily. Taking aspirin daily may be helpful if: ? You have had a heart attack or chest pain, or you are at risk for a heart attack. ? You have a condition in which certain heart vessels  are blocked (coronary artery disease), and you have had a procedure to treat it. Examples are:  Open-heart surgery, such as coronary artery bypass surgery (CABG).  Coronary angioplasty,which is done to widen a blood vessel of your heart.  Having a small mesh tube, or stent, placed in your coronary artery. ? You have had certain types of stroke or a mini-stroke known as a transient ischemic attack (TIA). ? You have a narrowing of the arteries that supply the limbs (peripheral artery disease, or PAD). ? You have long-term (chronic) heart rhythm problems, such as atrial fibrillation, and your health care provider thinks aspirin may help. ? You have valve disease or have had surgery on a valve. ? You are considered at increased risk of developing coronary artery disease or PAD.   Follow these instructions at home Medicines  Take over-the-counter and prescription medicines only as told by your health care provider.  If you are taking blood thinners: ? Talk with your health care provider before you take any medicines that contain aspirin or NSAIDs, such as ibuprofen. These medicines increase your risk for dangerous bleeding. ? Take your medicine exactly as told, at the same time every day. ? Avoid activities that could cause injury or bruising, and follow instructions about how to prevent falls. ? Wear a medical alert bracelet or carry a card that lists what medicines you take. General instructions  Do not drink alcohol if: ? Your health care provider tells you not to drink. ? You are pregnant, may be pregnant, or are planning to become pregnant.  If you drink alcohol: ? Limit how much you use to:  0-1 drink a day for women.  0-2 drinks a day for men. ? Be  aware of how much alcohol is in your drink. In the [place] one drink equals one 12 oz bottle of beer (355 mL), one 5 oz glass of wine (148 mL), or one 1 oz glass of hard liquor (44 mL).  Keep all follow-up visits as told by your health care provider. This is important. Where to find more information  The American Heart Association: www.heart.org Contact a health care provider if you have:  Unusual bleeding or bruising.  Stomach pain or nausea.  Ringing in your ears.  An allergic reaction that causes hives, itchy skin, or swelling of the lips, tongue, or face. Get help right away if:  You notice that your bowel movements are bloody, or dark red or black in color.  You vomit or cough up blood.  You have blood in your urine.  You cough, breathe loudly (wheeze), or feel short of breath.  You have chest pain, especially if the pain spreads to your arms, back, neck, or jaw.  You have a headache with confusion. You have any symptoms of a stroke. "BE FAST" is an easy way to remember the main warning signs of a stroke:  B - Balance. Signs are dizziness, sudden trouble walking, or loss of balance.  E - Eyes. Signs are trouble seeing or a sudden change in vision.  F - Face. Signs are sudden weakness or numbness of the face, or the face or eyelid drooping on one side.  A - Arms. Signs are weakness or numbness in an arm. This happens suddenly and usually on one side of the body.  S - Speech. Signs are sudden trouble speaking, slurred speech, or trouble understanding what people say.  T - Time. Time to call emergency services. Write down what time symptoms  started. You have other signs of a stroke, such as:  A sudden, severe headache with no known cause.  Nausea or vomiting.  Seizure. These symptoms may represent a serious problem that is an emergency. Do not wait to see if the symptoms will go away. Get medical help right away. Call your local emergency services (911 in the [place]  Do not drive yourself to the hospital. Summary  Aspirin use can help reduce the risk of blood clots, heart attacks, and other heart-related problems.  Daily use of aspirin can cause side effects.  Take aspirin only as told by your health care provider. Make sure that you understand how much to take and what form to take.  Your health care provider will help you determine whether it is safe and beneficial for you to take aspirin daily. This information is not intended to replace advice given to you by your health care provider. Make sure you discuss any questions you have with your health care provider. Document Revised: 01/22/2019 Document Reviewed: 01/22/2019 Elsevier Patient Education  2021 Elsevier Inc.  Atorvastatin Tablets What is this medicine? ATORVASTATIN (a TORE va sta tin) is a statin. It lowers bad cholesterol and triglyceride levels in the blood. It also increases good cholesterol levels. It is used with lifestyle changes, like diet and exercise. It may be used alone or with other drugs. This medicine may be used for other purposes; ask your health care provider or pharmacist if you have questions. COMMON BRAND NAME(S): Lipitor What should I tell my health care provider before I take this medicine? They need to know if you have any of these conditions:  diabetes (high blood sugar)  if you often drink alcohol  kidney disease  liver disease  muscle cramps, pain  stroke  thyroid disease  an unusual or allergic reaction to atorvastatin, other medicines, foods, dyes, or preservatives  pregnant or trying to get pregnant  breast-feeding How should I use this medicine? Take this medicine by mouth. Take it as directed on the prescription label at the same time every day. You can take it with or without food. If it upsets your stomach, take it with food. Keep taking it unless your health care provider tells you to stop. Do not take this medicine with grapefruit  juice. Talk to your health care provider about the use of this medicine in children. While it may be prescribed for children as young as 10 for selected conditions, precautions do apply. Overdosage: If you think you have taken too much of this medicine contact a poison control center or emergency room at once. NOTE: This medicine is only for you. Do not share this medicine with others. What if I miss a dose? If you miss a dose, take it as soon as you can. If it is almost time for your next dose, take only that dose. Do not take double or extra doses. What may interact with this medicine? Do not take this medicine with any of the following medications:  dasabuvir; ombitasvir; paritaprevir; ritonavir  ombitasvir; paritaprevir; ritonavir  posaconazole  red yeast rice This medicine may also interact with the following medications:  alcohol  birth control pills  certain antibiotics like erythromycin and clarithromycin  certain antivirals for HIV or hepatitis  certain medicines for cholesterol like fenofibrate, gemfibrozil, and niacin  certain medicines for fungal infections like ketoconazole and itraconazole  colchicine  cyclosporine  digoxin  grapefruit juice  rifampin This list may not describe all possible interactions. Give  your health care provider a list of all the medicines, herbs, non-prescription drugs, or dietary supplements you use. Also tell them if you smoke, drink alcohol, or use illegal drugs. Some items may interact with your medicine. What should I watch for while using this medicine? Visit your health care provider for regular checks on your progress. Tell your health care provider if your symptoms do not start to get better or if they get worse. Your health care provider may tell you to stop taking this medicine if you develop muscle problems. If your muscle problems do not go away after stopping this medicine, contact your health care provider. Do not become  pregnant while taking this medicine. Women should inform their health care provider if they wish to become pregnant or think they might be pregnant. There is potential for serious harm to an unborn child. Talk to your health care provider for more information. Do not breast-feed an infant while taking this medicine. Birth control may not work properly while you are taking this medicine. Talk to your health care provider about using an extra method of birth control. This medicine may increase blood sugar. Ask your health care provider if changes in diet or medicines are needed if you have diabetes. If you are going to need surgery or other procedure, tell your health care provider that you are using this medicine. Taking this medicine is only part of a total heart healthy program. Your health care provider may give you a special diet to follow. Avoid alcohol. Avoid smoking. Ask your health care provider how much you should exercise. What side effects may I notice from receiving this medicine? Side effects that you should report to your doctor or health care provider as soon as possible:  allergic reactions (skin rash, itching or hives; swelling of the face, lips, or tongue)  high blood sugar (increased hunger, thirst or urination; unusually weak or tired, blurry vision)  infection (fever, chills, cough, sore throat, pain or trouble passing urine)  joint pain  liver injury (dark yellow or brown urine; general ill feeling or flu-like symptoms; loss of appetite, right upper belly pain; unusually weak or tired, yellowing of the eyes or skin)  muscle injury (dark urine; trouble passing urine or change in the amount of urine; unusually weak or tired; muscle pain; back pain)  redness, blistering, peeling, or loosening of the skin, including inside the mouth Side effects that usually do not require medical attention (report to your doctor or health care provider if they continue or are  bothersome):  diarrhea  nausea  upset stomach This list may not describe all possible side effects. Call your doctor for medical advice about side effects. You may report side effects to FDA at 1-800-FDA-1088. Where should I keep my medicine? Keep out of the reach of children and pets. Store at room temperature between 20 and 25 degrees C (68 and 77 degrees F). Get rid of any unused medicine after the expiration date. To get rid of medicines that are no longer needed or have expired:  Take the medicine to a medicine take-back program. Check with your pharmacy or law enforcement to find a location.  If you cannot return the medicine, check the label or package insert to see if the medicine should be thrown out in the garbage or flushed down the toilet. If you are not sure, ask your health care provider. If it is safe to put it in the trash, take the medicine out of the  container. Mix the medicine with cat litter, dirt, coffee grounds, or other unwanted substance. Seal the mixture in a bag or container. Put it in the trash. NOTE: This sheet is a summary. It may not cover all possible information. If you have questions about this medicine, talk to your doctor, pharmacist, or health care provider.  2021 Elsevier/Gold Standard (2020-04-03 12:41:57)

## 2020-07-22 NOTE — Addendum Note (Signed)
Addended by: Gypsy Balsam on: 07/22/2020 04:48 PM   Modules accepted: Orders

## 2020-07-22 NOTE — Progress Notes (Signed)
Cardiology Consultation:    Date:  07/22/2020   ID:  Dorothy Morgan, DOB January 10, 1971, MRN [id number]  PCP:  Eloisa Northern, MD  Cardiologist:  Gypsy Balsam, MD   Referring MD: Ailene Ravel, MD   Chief Complaint  Patient presents with  . Hypertension  . Obesity    History of Present Illness:    Dorothy Morgan is a 50 y.o. female who is being seen today for the evaluation of chest pain at the request of Hamrick, Durward Fortes, MD.  Past medical history significant for essential hypertension, diabetes, dyslipidemia, obesity.  About 2 years ago she did have some surgery done on her neck at C4-C5 level.  She was referred to [place] because of episode of left shoulder left arm as well as chest pain.  She says she has been getting this for last few months pain can be quite severe up to 10 scale up to 10 she said sometimes she throws as bad as having a baby.  It is not related to exercise and what she typically she can lay flat on her belly on the stretching that usually relieve the pain but duration of the pain is typically up to 45 minutes.  There is no shortness of breath associated with this sensation but sweating can happen.  She also got some armpit sensation as well as chest pain and shoulder pain when she is trying to do things.  With some activities.  Overall it is a stable pattern going on for last few months.  Few weeks ago she ended going to [facility].  Biochemical markers being checked those were negative, chest CT will been done which showed no evidence of pulmonary emboli she was advised to follow-up with [place]. She does have family history of coronary artery disease but not premature. She used to smoke but quit many years ago She has multiple risk factors for coronary artery disease namely hypertension diabetes history of smoking as well as dyslipidemia with LDL of 165 based on blood test from April 02, 2020.  Past Medical History:  Diagnosis Date  . Acute pancreatitis  09/07/2014  . Angio-edema   . Asthma   . Cervicalgia   . Diabetes mellitus (HCC) 12/15/2017  . Fatty liver   . Glaucoma   . Hyperlipidemia   . Hypertension   . Moderate persistent asthma with acute exacerbation 02/17/2018  . Morbid obesity (HCC)   . Neuropathy 07/26/2014  . Obesity (BMI 30-39.9) 12/15/2017  . Pedal edema   . Prediabetes   . Primary angle-closure glaucoma 05/20/2017   Overview:  Added automatically from request for surgery [id number]  . Seasonal allergic rhinitis due to pollen 02/17/2018  . Strain of left shoulder 12/07/2016  . Vitamin D deficiency     Past Surgical History:  Procedure Laterality Date  . CESAREAN SECTION  1987  . CESAREAN SECTION    . EYE SURGERY Bilateral 2016  . EYE SURGERY     2016, 2017, 2019  . NECK SURGERY  2010,2015  . NECK SURGERY     2010, 2015, 2020 - [facility] Dr. Gerlene Fee, then Dr. Noel Gerold    Current Medications: Current Meds  Medication Sig  . baclofen (LIORESAL) 10 MG tablet Take 10 mg by mouth 3 (three) times daily.  . celecoxib (CELEBREX) 200 MG capsule Take 200 mg by mouth 2 (two) times daily.  . cyclobenzaprine (FLEXERIL) 10 MG tablet Take 10 mg by mouth at bedtime.  Marland Kitchen EPINEPHrine 0.3 mg/0.3 mL IJ SOAJ  injection Inject 0.3 mg into the muscle as needed for anaphylaxis.  Marland Kitchen gabapentin (NEURONTIN) 800 MG tablet Take 300 mg by mouth as needed for pain.  Marland Kitchen latanoprost (XALATAN) 0.005 % ophthalmic solution Place 1 drop into both eyes at bedtime.  . meloxicam (MOBIC) 7.5 MG tablet Take 15 mg by mouth daily.  . phentermine 37.5 MG capsule Take 37.5 mg by mouth every morning.  . predniSONE (DELTASONE) 10 MG tablet Take 10 mg by mouth daily with breakfast.  . triamterene-hydrochlorothiazide (DYAZIDE) 37.5-25 MG capsule Take 1 capsule by mouth daily.     Allergies:   Beef (bovine) protein and Galactose   Social History   Socioeconomic History  . Marital status: Married    Spouse name: Not on file  . Number of children: Not on file  . Years  of education: Not on file  . Highest education level: Not on file  Occupational History  . Not on file  Tobacco Use  . Smoking status: Former Smoker    Packs/day: 1.00    Years: 20.00    Pack years: 20.00    Types: Cigarettes  . Smokeless tobacco: Never Used  Substance and Sexual Activity  . Alcohol use: Yes  . Drug use: No  . Sexual activity: Not on file  Other Topics Concern  . Not on file  Social History Narrative   ** Merged History Encounter **       Social Determinants of Health   Financial Resource Strain: Not on file  Food Insecurity: Not on file  Transportation Needs: Not on file  Physical Activity: Not on file  Stress: Not on file  Social Connections: Not on file     Family History: The patient's family history includes Aneurysm in her sister; Asthma in her mother; Diabetes in her maternal grandmother and mother; High blood pressure in her brother, brother, mother, sister, and sister; Hyperlipidemia in her mother and sister; Hypertension in her brother, mother, and sister; Kidney disease in her mother; Tuberculosis in her mother. ROS:   Please see the history of present illness.    All 14 point review of systems negative except as described per history of present illness.  EKGs/Labs/Other Studies Reviewed:    The following studies were reviewed today:   EKG:  EKG is  ordered today.  The ekg ordered today demonstrates normal sinus rhythm APCs, possible left atrial enlargement, no ST segment changes.  Recent Labs: No results found for requested labs within last 8760 hours.  Recent Lipid Panel No results found for: CHOL, TRIG, HDL, CHOLHDL, VLDL, LDLCALC, LDLDIRECT  Physical Exam:    VS:  BP 136/78 (BP Location: Right Arm, Patient Position: Sitting)   Pulse 88   Ht 4\' 11"  (1.499 m)   Wt 205 lb (93 kg)   SpO2 94%   BMI 41.40 kg/m     Wt Readings from Last 3 Encounters:  07/22/20 205 lb (93 kg)  07/01/20 205 lb (93 kg)  12/16/17 180 lb (81.6 kg)      GEN:  Well nourished, well developed in no acute distress HEENT: Normal NECK: No JVD; No carotid bruits LYMPHATICS: No lymphadenopathy CARDIAC: RRR, no murmurs, no rubs, no gallops RESPIRATORY:  Clear to auscultation without rales, wheezing or rhonchi  ABDOMEN: Soft, non-tender, non-distended MUSCULOSKELETAL:  No edema; No deformity  SKIN: Warm and dry NEUROLOGIC:  Alert and oriented x 3 PSYCHIATRIC:  Normal affect   ASSESSMENT:    1. Primary hypertension   2. Atypical chest  pain   3. Moderate persistent asthma, unspecified whether complicated   4. Mixed hyperlipidemia   5. Type 2 diabetes mellitus without complication, without long-term current use of insulin (HCC)    PLAN:    In order of problems listed above:  1. Atypical chest pain in this lady with multiple risk factors for coronary artery disease.  Adamantly we can ignore the pain I suspect it is a portion of it is related to the neck surgery that she had muscle spasm but the components of this penetrating make me concerned.  We did talk about options for the situation we elected to proceed with Lexiscan.  I also advised her to start taking 1 baby aspirin every single day, I will also start her with small dose of beta-blocker. 2. Essential hypertension blood pressure seems to be well controlled continue present management. 3. Moderate asthma I wonder about potential side effect of metoprolol hopefully she will not have any and she will continue that medication. 4. Mixed dyslipidemia her fasting lipid profile is unacceptably high.  I will start her with Lipitor 10 mg daily. 5. Type 2 diabetes that being followed by internal medicine team last hemoglobin A1c I have is 5.5% which is from November of last year. 6. Dyspnea on exertion with swelling of lower extremities.  I will ask her to have an echocardiogram to assess left ventricle ejection fraction.   Medication Adjustments/Labs and Tests Ordered: Current medicines are  reviewed at length with the patient today.  Concerns regarding medicines are outlined above.  No orders of the defined types were placed in this encounter.  No orders of the defined types were placed in this encounter.   Signed, Georgeanna Lea, MD, [facility] 07/22/2020 2:29 PM    [facility]

## 2020-07-22 NOTE — Addendum Note (Signed)
Addended by: Hazle Quant on: 07/22/2020 02:42 PM   Modules accepted: Orders

## 2020-07-22 NOTE — Progress Notes (Signed)
k

## 2020-08-05 ENCOUNTER — Telehealth (HOSPITAL_COMMUNITY): Payer: Self-pay | Admitting: *Deleted

## 2020-08-05 NOTE — Telephone Encounter (Signed)
Patient given detailed instructions per Myocardial Perfusion Study Information Sheet for the test on 08/14/20 at 8:15. Patient notified to arrive 15 minutes early and that it is imperative to arrive on time for appointment to keep from having the test rescheduled.  If you need to cancel or reschedule your appointment, please call the office within 24 hours of your appointment. . Patient verbalized understanding.Dorothy Morgan

## 2020-08-14 ENCOUNTER — Ambulatory Visit (INDEPENDENT_AMBULATORY_CARE_PROVIDER_SITE_OTHER): Payer: BLUE CROSS/BLUE SHIELD

## 2020-08-14 ENCOUNTER — Other Ambulatory Visit: Payer: Self-pay

## 2020-08-14 DIAGNOSIS — I10 Essential (primary) hypertension: Secondary | ICD-10-CM | POA: Diagnosis not present

## 2020-08-14 DIAGNOSIS — E119 Type 2 diabetes mellitus without complications: Secondary | ICD-10-CM

## 2020-08-14 DIAGNOSIS — R0789 Other chest pain: Secondary | ICD-10-CM | POA: Diagnosis not present

## 2020-08-14 DIAGNOSIS — J4540 Moderate persistent asthma, uncomplicated: Secondary | ICD-10-CM

## 2020-08-14 DIAGNOSIS — E782 Mixed hyperlipidemia: Secondary | ICD-10-CM

## 2020-08-14 LAB — MYOCARDIAL PERFUSION IMAGING
LV dias vol: 64 mL (ref 46–106)
LV sys vol: 21 mL
Peak HR: 96 {beats}/min
Rest HR: 69 {beats}/min
SDS: 1
SRS: 0
SSS: 1
TID: 1.11

## 2020-08-14 LAB — ECHOCARDIOGRAM COMPLETE
Area-P 1/2: 5.09 cm2
S' Lateral: 2.3 cm

## 2020-08-14 MED ORDER — TECHNETIUM TC 99M TETROFOSMIN IV KIT
31.4000 | PACK | Freq: Once | INTRAVENOUS | Status: AC | PRN
  Administered 2020-08-14: 31.4 via INTRAVENOUS

## 2020-08-14 MED ORDER — TECHNETIUM TC 99M TETROFOSMIN IV KIT
10.8000 | PACK | Freq: Once | INTRAVENOUS | Status: AC | PRN
  Administered 2020-08-14: 10.8 via INTRAVENOUS

## 2020-08-14 MED ORDER — REGADENOSON 0.4 MG/5ML IV SOLN
0.4000 mg | Freq: Once | INTRAVENOUS | Status: AC
  Administered 2020-08-14: 0.4 mg via INTRAVENOUS

## 2020-08-14 NOTE — Progress Notes (Signed)
Complete echocardiogram performed.  Jimmy Turhan Chill RDCS, RVT  

## 2020-08-19 ENCOUNTER — Telehealth: Payer: Self-pay

## 2020-08-19 NOTE — Telephone Encounter (Signed)
-----   Message from Georgeanna Lea, MD sent at 08/15/2020 10:28 AM EDT ----- Echocardiogram showed preserved left ventricle ejection fraction, mild LVH, mild mitral valve regurgitation, overall looks good

## 2020-08-19 NOTE — Telephone Encounter (Signed)
-----   Message from Georgeanna Lea, MD sent at 08/15/2020 10:29 AM EDT ----- Stress test is negative.

## 2020-08-19 NOTE — Telephone Encounter (Signed)
Patient notified of test results 

## 2020-09-22 ENCOUNTER — Other Ambulatory Visit: Payer: Self-pay

## 2020-09-22 DIAGNOSIS — T783XXA Angioneurotic edema, initial encounter: Secondary | ICD-10-CM | POA: Insufficient documentation

## 2020-09-22 DIAGNOSIS — M542 Cervicalgia: Secondary | ICD-10-CM | POA: Insufficient documentation

## 2020-09-23 ENCOUNTER — Ambulatory Visit: Payer: Self-pay | Admitting: Cardiology

## 2020-10-17 ENCOUNTER — Other Ambulatory Visit: Payer: Self-pay

## 2020-10-17 ENCOUNTER — Ambulatory Visit: Payer: BLUE CROSS/BLUE SHIELD | Admitting: Cardiology

## 2020-10-17 ENCOUNTER — Ambulatory Visit: Payer: Self-pay | Admitting: Cardiology

## 2020-10-17 ENCOUNTER — Encounter: Payer: Self-pay | Admitting: Cardiology

## 2020-10-17 VITALS — BP 110/78 | HR 88 | Ht 59.0 in | Wt 203.0 lb

## 2020-10-17 DIAGNOSIS — E119 Type 2 diabetes mellitus without complications: Secondary | ICD-10-CM | POA: Diagnosis not present

## 2020-10-17 DIAGNOSIS — R0789 Other chest pain: Secondary | ICD-10-CM

## 2020-10-17 DIAGNOSIS — I10 Essential (primary) hypertension: Secondary | ICD-10-CM | POA: Diagnosis not present

## 2020-10-17 DIAGNOSIS — E782 Mixed hyperlipidemia: Secondary | ICD-10-CM

## 2020-10-17 NOTE — Patient Instructions (Signed)
Medication Instructions:  Your physician recommends that you continue on your current medications as directed. Please refer to the Current Medication list given to you today.  *If you need a refill on your cardiac medications before your next appointment, please call your pharmacy*   Lab Work: NONE If you have labs (blood work) drawn today and your tests are completely normal, you will receive your results only by: MyChart Message (if you have MyChart) OR A paper copy in the mail If you have any lab test that is abnormal or we need to change your treatment, we will call you to review the results.   Testing/Procedures: NONE   Follow-Up: At [facility], you and your health needs are our priority.  As part of our continuing mission to provide you with exceptional heart care, we have created designated Provider Care Teams.  These Care Teams include your primary Cardiologist (physician) and Advanced Practice Providers (APPs -  Physician Assistants and Nurse Practitioners) who all work together to provide you with the care you need, when you need it.  We recommend signing up for the patient portal called "MyChart".  Sign up information is provided on this After Visit Summary.  MyChart is used to connect with patients for Virtual Visits (Telemedicine).  Patients are able to view lab/test results, encounter notes, upcoming appointments, etc.  Non-urgent messages can be sent to your provider as well.   To learn more about what you can do with MyChart, go to [email or web address].    Your next appointment:   5 month(s)  The format for your next appointment:   In Person  Provider:   Robert Krasowski, MD    Other Instructions   

## 2020-10-17 NOTE — Progress Notes (Signed)
Cardiology Office Note:    Date:  10/17/2020   ID:  Jimma Ortman, DOB Apr 22, 1971, MRN [id number]  PCP:  Eloisa Northern, MD  Cardiologist:  Gypsy Balsam, MD    Referring MD: Eloisa Northern, MD   Chief Complaint  Patient presents with   Follow-up  To have episodes of chest pain but much less frequent than before  History of Present Illness:    Dorothy Morgan is a 50 y.o. female with multiple risk factors for coronary artery disease namely essential hypertension, diabetes, history of smoking however likely she quit 7 years ago.  About 2 years ago she had some surgery on her neck C4C5 since that time she been experiencing some chest pain she was referred to [place] for evaluation.  She said the pain can happen anytime she could be sitting and she gets this last for few minutes interesting walking around helps with the pain.  She did have a stress test which showed no evidence of ischemia, echocardiogram showed normal ejection fraction.  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 03/13/2019   Cervicalgia    Diabetes mellitus (HCC) 12/15/2017   Fatty liver    Glaucoma    Hyperlipidemia    Hypertension    Moderate persistent asthma with acute exacerbation 02/17/2018   Morbid obesity (HCC)    Neuropathy 07/26/2014   Obesity (BMI 30-39.9) 12/15/2017   Pedal edema    Prediabetes    Primary angle-closure glaucoma 05/20/2017   Overview:  Added automatically from request for surgery [id number]   Seasonal allergic rhinitis due to pollen 02/17/2018   Strain of left shoulder 12/07/2016   Vitamin D deficiency     Past Surgical History:  Procedure Laterality Date   CESAREAN SECTION  1987   CESAREAN SECTION     EYE SURGERY Bilateral 2016   EYE SURGERY     2016, 2017, 2019   NECK SURGERY  2010,2015   NECK SURGERY     2010, 2015, 2020 - [facility] Dr. Gerlene Fee, then  Dr. Noel Gerold    Current Medications: Current Meds  Medication Sig   aspirin EC 81 MG tablet Take 1 tablet (81 mg total) by mouth daily. Swallow whole.   atorvastatin (LIPITOR) 10 MG tablet Take 1 tablet (10 mg total) by mouth daily.   baclofen (LIORESAL) 10 MG tablet Take 10 mg by mouth 3 (three) times daily.   celecoxib (CELEBREX) 200 MG capsule Take 200 mg by mouth 2 (two) times daily.   cyclobenzaprine (FLEXERIL) 10 MG tablet Take 10 mg by mouth at bedtime.   EPINEPHrine 0.3 mg/0.3 mL IJ SOAJ injection Inject 0.3 mg into the muscle as needed for anaphylaxis.   gabapentin (NEURONTIN) 800 MG tablet Take 300 mg by mouth as needed for pain.   latanoprost (XALATAN) 0.005 % ophthalmic solution Place 1 drop into both eyes at bedtime.   meloxicam (MOBIC) 7.5 MG tablet Take 15 mg by mouth daily.   phentermine 37.5 MG capsule Take 37.5 mg by mouth every morning.   predniSONE (DELTASONE) 10 MG tablet Take 10 mg by mouth daily with breakfast.   pregabalin (LYRICA) 75 MG capsule Take 75 mg by mouth 2 (two) times daily.   triamterene-hydrochlorothiazide (DYAZIDE) 37.5-25 MG capsule Take 1 capsule by mouth daily.     Allergies:   Beef (bovine) protein and Galactose  Social History   Socioeconomic History   Marital status: Married    Spouse name: Not on file   Number of children: Not on file   Years of education: Not on file   Highest education level: Not on file  Occupational History   Not on file  Tobacco Use   Smoking status: Former    Packs/day: 1.00    Years: 20.00    Pack years: 20.00    Types: Cigarettes   Smokeless tobacco: Never  Substance and Sexual Activity   Alcohol use: Yes   Drug use: No   Sexual activity: Not on file  Other Topics Concern   Not on file  Social History Narrative   ** Merged History Encounter **       Social Determinants of Health   Financial Resource Strain: Not on file  Food Insecurity: Not on file  Transportation Needs: Not on file  Physical  Activity: Not on file  Stress: Not on file  Social Connections: Not on file     Family History: The patient's family history includes Aneurysm in her sister; Asthma in her mother; Diabetes in her maternal grandmother and mother; High blood pressure in her brother, brother, mother, sister, and sister; Hyperlipidemia in her mother and sister; Hypertension in her brother, mother, and sister; Kidney disease in her mother; Tuberculosis in her mother. ROS:   Please see the history of present illness.    All 14 point review of systems negative except as described per history of present illness  EKGs/Labs/Other Studies Reviewed:      Recent Labs: No results found for requested labs within last 8760 hours.  Recent Lipid Panel No results found for: CHOL, TRIG, HDL, CHOLHDL, VLDL, LDLCALC, LDLDIRECT  Physical Exam:    VS:  BP 110/78 (BP Location: Left Arm, Patient Position: Sitting, Cuff Size: Normal)   Pulse 88   Ht 4\' 11"  (1.499 m)   Wt 203 lb (92.1 kg)   SpO2 96%   BMI 41.00 kg/m     Wt Readings from Last 3 Encounters:  10/17/20 203 lb (92.1 kg)  08/14/20 205 lb (93 kg)  07/22/20 205 lb (93 kg)     GEN:  Well nourished, well developed in no acute distress HEENT: Normal NECK: No JVD; No carotid bruits LYMPHATICS: No lymphadenopathy CARDIAC: RRR, no murmurs, no rubs, no gallops RESPIRATORY:  Clear to auscultation without rales, wheezing or rhonchi  ABDOMEN: Soft, non-tender, non-distended MUSCULOSKELETAL:  No edema; No deformity  SKIN: Warm and dry LOWER EXTREMITIES: no swelling NEUROLOGIC:  Alert and oriented x 3 PSYCHIATRIC:  Normal affect   ASSESSMENT:    1. Atypical chest pain   2. Mixed hyperlipidemia   3. Primary hypertension   4. Type 2 diabetes mellitus without complication, without long-term current use of insulin (HCC)    PLAN:    In order of problems listed above:  Atypical chest pain: Stress test reviewed with the patient no evidence of ischemia.  We will  continue risk factors modification which include antiplatelet therapy as well as statin which she is already on. Mixed dyslipidemia I did review K PN which show LDL of 153 HDL 45.  Her primary care physician increased dose of statin based on this results.  Will await results of her repeat fasting lipid profile. Essential hypertension blood pressure well controlled continue present management.   Diabetes mellitus: Her hemoglobin A1c is 6.8.  Well-controlled continue present management. I asked her to get Maalox and Mylanta and  try to see if it helps when she got this episode of pain.  I told her all test indicates that this is not cardiac related.  However I will see her back in the next few months to see how she does.   Medication Adjustments/Labs and Tests Ordered: Current medicines are reviewed at length with the patient today.  Concerns regarding medicines are outlined above.  No orders of the defined types were placed in this encounter.  Medication changes: No orders of the defined types were placed in this encounter.   Signed, Georgeanna Lea, MD, [facility] 10/17/2020 10:17 AM    [facility]

## 2020-11-04 DIAGNOSIS — R7989 Other specified abnormal findings of blood chemistry: Secondary | ICD-10-CM | POA: Diagnosis not present

## 2020-11-04 DIAGNOSIS — E785 Hyperlipidemia, unspecified: Secondary | ICD-10-CM | POA: Diagnosis not present

## 2020-11-18 DIAGNOSIS — E785 Hyperlipidemia, unspecified: Secondary | ICD-10-CM | POA: Diagnosis not present

## 2020-11-18 DIAGNOSIS — Z6839 Body mass index (BMI) 39.0-39.9, adult: Secondary | ICD-10-CM | POA: Diagnosis not present

## 2020-11-18 DIAGNOSIS — R600 Localized edema: Secondary | ICD-10-CM | POA: Diagnosis not present

## 2021-01-01 DIAGNOSIS — Z5321 Procedure and treatment not carried out due to patient leaving prior to being seen by health care provider: Secondary | ICD-10-CM | POA: Diagnosis not present

## 2021-01-01 DIAGNOSIS — M62838 Other muscle spasm: Secondary | ICD-10-CM | POA: Diagnosis not present

## 2021-01-01 DIAGNOSIS — R109 Unspecified abdominal pain: Secondary | ICD-10-CM | POA: Diagnosis not present

## 2021-01-06 DIAGNOSIS — I499 Cardiac arrhythmia, unspecified: Secondary | ICD-10-CM | POA: Diagnosis not present

## 2021-01-07 DIAGNOSIS — Z6837 Body mass index (BMI) 37.0-37.9, adult: Secondary | ICD-10-CM | POA: Diagnosis not present

## 2021-01-07 DIAGNOSIS — M47816 Spondylosis without myelopathy or radiculopathy, lumbar region: Secondary | ICD-10-CM | POA: Diagnosis not present

## 2021-01-07 DIAGNOSIS — M545 Low back pain, unspecified: Secondary | ICD-10-CM | POA: Diagnosis not present

## 2021-01-07 DIAGNOSIS — I10 Essential (primary) hypertension: Secondary | ICD-10-CM | POA: Diagnosis not present

## 2021-01-14 DIAGNOSIS — K760 Fatty (change of) liver, not elsewhere classified: Secondary | ICD-10-CM | POA: Diagnosis not present

## 2021-01-14 DIAGNOSIS — E1169 Type 2 diabetes mellitus with other specified complication: Secondary | ICD-10-CM | POA: Diagnosis not present

## 2021-01-14 DIAGNOSIS — E785 Hyperlipidemia, unspecified: Secondary | ICD-10-CM | POA: Diagnosis not present

## 2021-01-14 DIAGNOSIS — I10 Essential (primary) hypertension: Secondary | ICD-10-CM | POA: Diagnosis not present

## 2021-02-02 DIAGNOSIS — M47816 Spondylosis without myelopathy or radiculopathy, lumbar region: Secondary | ICD-10-CM | POA: Diagnosis not present

## 2021-02-02 DIAGNOSIS — G894 Chronic pain syndrome: Secondary | ICD-10-CM | POA: Diagnosis not present

## 2021-02-04 DIAGNOSIS — M47816 Spondylosis without myelopathy or radiculopathy, lumbar region: Secondary | ICD-10-CM | POA: Diagnosis not present

## 2021-02-20 DIAGNOSIS — R7989 Other specified abnormal findings of blood chemistry: Secondary | ICD-10-CM | POA: Diagnosis not present

## 2021-02-20 DIAGNOSIS — E785 Hyperlipidemia, unspecified: Secondary | ICD-10-CM | POA: Diagnosis not present

## 2021-03-02 DIAGNOSIS — L03314 Cellulitis of groin: Secondary | ICD-10-CM | POA: Diagnosis not present

## 2021-03-19 ENCOUNTER — Ambulatory Visit: Payer: BLUE CROSS/BLUE SHIELD | Admitting: Cardiology

## 2021-03-19 ENCOUNTER — Encounter: Payer: Self-pay | Admitting: Cardiology

## 2021-03-19 ENCOUNTER — Other Ambulatory Visit: Payer: Self-pay

## 2021-03-19 VITALS — BP 136/78 | HR 68 | Ht 60.0 in | Wt 200.2 lb

## 2021-03-19 DIAGNOSIS — R7303 Prediabetes: Secondary | ICD-10-CM

## 2021-03-19 DIAGNOSIS — R0789 Other chest pain: Secondary | ICD-10-CM | POA: Diagnosis not present

## 2021-03-19 DIAGNOSIS — R7989 Other specified abnormal findings of blood chemistry: Secondary | ICD-10-CM | POA: Insufficient documentation

## 2021-03-19 DIAGNOSIS — I10 Essential (primary) hypertension: Secondary | ICD-10-CM

## 2021-03-19 DIAGNOSIS — R7301 Impaired fasting glucose: Secondary | ICD-10-CM

## 2021-03-19 DIAGNOSIS — E782 Mixed hyperlipidemia: Secondary | ICD-10-CM | POA: Diagnosis not present

## 2021-03-19 DIAGNOSIS — E039 Hypothyroidism, unspecified: Secondary | ICD-10-CM | POA: Insufficient documentation

## 2021-03-19 DIAGNOSIS — R232 Flushing: Secondary | ICD-10-CM | POA: Insufficient documentation

## 2021-03-19 DIAGNOSIS — N951 Menopausal and female climacteric states: Secondary | ICD-10-CM | POA: Insufficient documentation

## 2021-03-19 MED ORDER — ATORVASTATIN CALCIUM 20 MG PO TABS: 20.0000 mg | ORAL_TABLET | Freq: Every day | ORAL | 3 refills | Status: DC

## 2021-03-19 NOTE — Patient Instructions (Signed)
Medication Instructions:  Your physician has recommended you make the following change in your medication: INCREASE LIPITOR TO 20 MG ONCE DAILY  *If you need a refill on your cardiac medications before your next appointment, please call your pharmacy*   Lab Work: Your physician recommends that you return for lab work in: 6 WEEKS FOR FASTING LIPID PANEL, AST & ALT DO NOT NEED APPOINTMENT, COME BETWEEN 8 - 12:00 OR 1:30 - 4:30  If you have labs (blood work) drawn today and your tests are completely normal, you will receive your results only by: MyChart Message (if you have MyChart) OR A paper copy in the mail If you have any lab test that is abnormal or we need to change your treatment, we will call you to review the results.   Testing/Procedures: NONE   Follow-Up: At [facility], you and your health needs are our priority.  As part of our continuing mission to provide you with exceptional heart care, we have created designated Provider Care Teams.  These Care Teams include your primary Cardiologist (physician) and Advanced Practice Providers (APPs -  Physician Assistants and Nurse Practitioners) who all work together to provide you with the care you need, when you need it.  We recommend signing up for the patient portal called "MyChart".  Sign up information is provided on this After Visit Summary.  MyChart is used to connect with patients for Virtual Visits (Telemedicine).  Patients are able to view lab/test results, encounter notes, upcoming appointments, etc.  Non-urgent messages can be sent to your provider as well.   To learn more about what you can do with MyChart, go to [email or web address].    Your next appointment:   1 year(s)  The format for your next appointment:   In Person  Provider:   Gypsy Balsam, MD    Other Instructions

## 2021-03-19 NOTE — Progress Notes (Signed)
Cardiology Office Note:    Date:  03/19/2021   ID:  Dorothy Morgan, DOB 08/22/1970, MRN [id number]  PCP:  Ailene Ravel, MD  Cardiologist:  Gypsy Balsam, MD    Referring MD: Eloisa Northern, MD   Chief Complaint  Patient presents with   Follow-up  I am doing fine, have much less spasm in my neck  History of Present Illness:    Dorothy Morgan is a 50 y.o. female with past medical history significant for multiple risk factors for coronary artery disease, namely essential hypertension, diabetes, history of smoking however likely she quit 7 years ago.  She was sent to [place] to evaluate for atypical chest pain.  Stress test has been performed which is negative.  Since have seen her last time she is doing much better she says she does not have any more spasm in her neck.  It was a much better and she is very happy the way she feels.  Denies having atypical chest pain tightness squeezing pressure burning chest.  No palpitations no dizziness no swelling of lower extremities  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 03/13/2019   Cervicalgia    Diabetes mellitus (HCC) 12/15/2017   Fatty liver    Glaucoma    Hyperlipidemia    Hypertension    Moderate persistent asthma with acute exacerbation 02/17/2018   Morbid obesity (HCC)    Neuropathy 07/26/2014   Obesity (BMI 30-39.9) 12/15/2017   Pedal edema    Prediabetes    Primary angle-closure glaucoma 05/20/2017   Overview:  Added automatically from request for surgery [id number]   Seasonal allergic rhinitis due to pollen 02/17/2018   Strain of left shoulder 12/07/2016   Vitamin D deficiency     Past Surgical History:  Procedure Laterality Date   CESAREAN SECTION  1987   CESAREAN SECTION     EYE SURGERY Bilateral 2016   EYE SURGERY     2016, 2017, 2019   NECK SURGERY  2010,2015   NECK SURGERY      2010, 2015, 2020 - [facility] Dr. Gerlene Fee, then Dr. Noel Gerold    Current Medications: Current Meds  Medication Sig   aspirin EC 81 MG tablet Take 1 tablet (81 mg total) by mouth daily. Swallow whole.   atorvastatin (LIPITOR) 10 MG tablet Take 1 tablet (10 mg total) by mouth daily.   baclofen (LIORESAL) 10 MG tablet Take 10 mg by mouth 3 (three) times daily.   celecoxib (CELEBREX) 200 MG capsule Take 200 mg by mouth 2 (two) times daily.   cyclobenzaprine (FLEXERIL) 10 MG tablet Take 10 mg by mouth at bedtime.   EPINEPHrine 0.3 mg/0.3 mL IJ SOAJ injection Inject 0.3 mg into the muscle as needed for anaphylaxis.   gabapentin (NEURONTIN) 800 MG tablet Take 300 mg by mouth as needed for pain.   latanoprost (XALATAN) 0.005 % ophthalmic solution Place 1 drop into both eyes at bedtime.   meloxicam (MOBIC) 7.5 MG tablet Take 15 mg by mouth daily.   phentermine 37.5 MG capsule Take 37.5 mg by mouth every morning.   predniSONE (DELTASONE) 10 MG tablet Take 10 mg by mouth daily with breakfast.   pregabalin (LYRICA) 75 MG capsule Take 75 mg by mouth 2 (two) times daily.   triamterene-hydrochlorothiazide (DYAZIDE) 37.5-25 MG capsule Take 1 capsule by mouth daily.  Allergies:   Alpha-gal, Beef (bovine) protein, and Galactose   Social History   Socioeconomic History   Marital status: Married    Spouse name: Not on file   Number of children: Not on file   Years of education: Not on file   Highest education level: Not on file  Occupational History   Not on file  Tobacco Use   Smoking status: Former    Packs/day: 1.00    Years: 20.00    Pack years: 20.00    Types: Cigarettes   Smokeless tobacco: Never  Substance and Sexual Activity   Alcohol use: Yes   Drug use: No   Sexual activity: Not on file  Other Topics Concern   Not on file  Social History Narrative   ** Merged History Encounter **       Social Determinants of Health   Financial Resource Strain: Not on file  Food Insecurity: Not on  file  Transportation Needs: Not on file  Physical Activity: Not on file  Stress: Not on file  Social Connections: Not on file     Family History: The patient's family history includes Aneurysm in her sister; Asthma in her mother; Diabetes in her maternal grandmother and mother; High blood pressure in her brother, brother, mother, sister, and sister; Hyperlipidemia in her mother and sister; Hypertension in her brother, mother, and sister; Kidney disease in her mother; Tuberculosis in her mother. ROS:   Please see the history of present illness.    All 14 point review of systems negative except as described per history of present illness  EKGs/Labs/Other Studies Reviewed:      Recent Labs: No results found for requested labs within last 8760 hours.  Recent Lipid Panel No results found for: CHOL, TRIG, HDL, CHOLHDL, VLDL, LDLCALC, LDLDIRECT  Physical Exam:    VS:  BP 136/78 (BP Location: Left Arm, Patient Position: Sitting)   Pulse 68   Ht 5' (1.524 m)   Wt 200 lb 3.2 oz (90.8 kg)   SpO2 97%   BMI 39.10 kg/m     Wt Readings from Last 3 Encounters:  03/19/21 200 lb 3.2 oz (90.8 kg)  10/17/20 203 lb (92.1 kg)  08/14/20 205 lb (93 kg)     GEN:  Well nourished, well developed in no acute distress HEENT: Normal NECK: No JVD; No carotid bruits LYMPHATICS: No lymphadenopathy CARDIAC: RRR, no murmurs, no rubs, no gallops RESPIRATORY:  Clear to auscultation without rales, wheezing or rhonchi  ABDOMEN: Soft, non-tender, non-distended MUSCULOSKELETAL:  No edema; No deformity  SKIN: Warm and dry LOWER EXTREMITIES: no swelling NEUROLOGIC:  Alert and oriented x 3 PSYCHIATRIC:  Normal affect   ASSESSMENT:    1. Primary hypertension   2. Impaired fasting glucose   3. Mixed hyperlipidemia   4. Atypical chest pain   5. Prediabetes    PLAN:    In order of problems listed above:  Essential hypertension blood pressure well controlled today continue present management. Impaired  fasting glucose to be followed by antimedicine team.  I did review K PN which show me hemoglobin A1c from 01/14/2021 which was 6.2. Mixed dyslipidemia her cholesterol still not well controlled.  LDL 133 HDL 47 this is from K PN from 02/20/2021 I will double the dose of Lipitor and then recheck fasting lipid profile liver function test in 6 weeks. Atypical chest pain denies having any   Medication Adjustments/Labs and Tests Ordered: Current medicines are reviewed at length with the patient today.  Concerns regarding medicines are outlined above.  No orders of the defined types were placed in this encounter.  Medication changes: No orders of the defined types were placed in this encounter.   Signed, Georgeanna Lea, MD, [facility] 03/19/2021 10:01 AM    [facility]

## 2021-04-03 DIAGNOSIS — H401111 Primary open-angle glaucoma, right eye, mild stage: Secondary | ICD-10-CM | POA: Diagnosis not present

## 2021-04-03 DIAGNOSIS — H401123 Primary open-angle glaucoma, left eye, severe stage: Secondary | ICD-10-CM | POA: Diagnosis not present

## 2021-04-03 DIAGNOSIS — H04121 Dry eye syndrome of right lacrimal gland: Secondary | ICD-10-CM | POA: Diagnosis not present

## 2021-04-20 DIAGNOSIS — M47816 Spondylosis without myelopathy or radiculopathy, lumbar region: Secondary | ICD-10-CM | POA: Diagnosis not present

## 2021-07-11 DIAGNOSIS — M25519 Pain in unspecified shoulder: Secondary | ICD-10-CM | POA: Diagnosis not present

## 2021-07-11 DIAGNOSIS — M25529 Pain in unspecified elbow: Secondary | ICD-10-CM | POA: Diagnosis not present

## 2021-10-22 ENCOUNTER — Telehealth: Payer: Self-pay

## 2021-10-22 NOTE — Telephone Encounter (Signed)
Per Dr. Bing Matter, arrange nurse visit for an EGK (Long QT). I called and LM to return my call.

## 2021-10-27 ENCOUNTER — Ambulatory Visit (INDEPENDENT_AMBULATORY_CARE_PROVIDER_SITE_OTHER): Payer: Commercial Managed Care - HMO

## 2021-10-27 VITALS — BP 110/80 | HR 74 | Ht 60.0 in | Wt 187.0 lb

## 2021-10-27 DIAGNOSIS — R0789 Other chest pain: Secondary | ICD-10-CM

## 2021-11-04 ENCOUNTER — Telehealth: Payer: Self-pay | Admitting: Cardiology

## 2021-11-04 ENCOUNTER — Telehealth: Payer: Self-pay

## 2021-11-04 NOTE — Telephone Encounter (Signed)
Pt had an EKG in follow up from PCP. The PCP EKG was unclear. EKG repeated in office. Per Dr. Bing Matter her EKG was normal. Advised pt. She verbalized understanding and had no further questions.

## 2021-11-04 NOTE — Telephone Encounter (Signed)
Patient is calling for results to her EKG.  

## 2021-11-04 NOTE — Telephone Encounter (Signed)
Attempted to call the patient. Patient did not answer. Unable to leave voice mail.

## 2022-01-26 DIAGNOSIS — S70361A Insect bite (nonvenomous), right thigh, initial encounter: Secondary | ICD-10-CM | POA: Diagnosis not present

## 2022-04-09 ENCOUNTER — Other Ambulatory Visit: Payer: Self-pay

## 2022-04-09 DIAGNOSIS — R7989 Other specified abnormal findings of blood chemistry: Secondary | ICD-10-CM | POA: Insufficient documentation

## 2022-04-13 ENCOUNTER — Encounter: Payer: Self-pay | Admitting: Cardiology

## 2022-04-13 ENCOUNTER — Ambulatory Visit: Payer: Commercial Managed Care - HMO | Attending: Cardiology | Admitting: Cardiology

## 2022-04-13 VITALS — BP 144/90 | HR 80 | Ht 59.0 in | Wt 201.4 lb

## 2022-04-13 DIAGNOSIS — E119 Type 2 diabetes mellitus without complications: Secondary | ICD-10-CM | POA: Diagnosis not present

## 2022-04-13 DIAGNOSIS — E782 Mixed hyperlipidemia: Secondary | ICD-10-CM | POA: Diagnosis not present

## 2022-04-13 DIAGNOSIS — Z6839 Body mass index (BMI) 39.0-39.9, adult: Secondary | ICD-10-CM

## 2022-04-13 DIAGNOSIS — R0789 Other chest pain: Secondary | ICD-10-CM | POA: Diagnosis not present

## 2022-04-13 MED ORDER — ATORVASTATIN CALCIUM 40 MG PO TABS: 40.0000 mg | ORAL_TABLET | Freq: Every day | ORAL | 3 refills | Status: DC

## 2022-04-13 NOTE — Patient Instructions (Signed)
Medication Instructions:  Your physician has recommended you make the following change in your medication:   Increase your Atorvastatin (Lipitor) to 40 mg daily.  *If you need a refill on your cardiac medications before your next appointment, please call your pharmacy*   Lab Work: Your physician recommends that you return for lab work in: 6 weeks You need to have labs done when you are fasting.  You can come Monday through Friday 8:30 am to 12:00 pm and 1:15 to 4:30. You do not need to make an appointment as the order has already been placed. The labs you are going to have done are AST, ALT and Lipids.  If you have labs (blood work) drawn today and your tests are completely normal, you will receive your results only by: MyChart Message (if you have MyChart) OR A paper copy in the mail If you have any lab test that is abnormal or we need to change your treatment, we will call you to review the results.   Testing/Procedures: None ordered   Follow-Up: At [facility], you and your health needs are our priority.  As part of our continuing mission to provide you with exceptional heart care, we have created designated Provider Care Teams.  These Care Teams include your primary Cardiologist (physician) and Advanced Practice Providers (APPs -  Physician Assistants and Nurse Practitioners) who all work together to provide you with the care you need, when you need it.  We recommend signing up for the patient portal called "MyChart".  Sign up information is provided on this After Visit Summary.  MyChart is used to connect with patients for Virtual Visits (Telemedicine).  Patients are able to view lab/test results, encounter notes, upcoming appointments, etc.  Non-urgent messages can be sent to your provider as well.   To learn more about what you can do with MyChart, go to [email or web address].    Your next appointment:   12 month(s)  The format for your next appointment:   In  Person  Provider:   Gypsy Balsam, MD   Other Instructions NA

## 2022-04-13 NOTE — Progress Notes (Unsigned)
Cardiology Office Note:    Date:  04/13/2022   ID:  Dorothy Morgan, DOB 22-May-1970, MRN [id number]  PCP:  Ailene Ravel, MD  Cardiologist:  Gypsy Balsam, MD    Referring MD: Ailene Ravel, MD   Chief Complaint  Patient presents with   chest pain    Ongoing for 2 weeks  HR elevates     History of Present Illness:    Dorothy Morgan is a 51 y.o. female with past medical history significant for multiple risk factors for coronary artery disease namely essential hypertension, dyslipidemia, diabetes.  She was evaluated last time a year ago stress test was done negative echocardiogram was normal.  She comes today to my office because she been experiencing chest pain for 2 weeks it is a constant sensation of the left side of her chest pressing the chest wall make it worse.  She took some Naprosyn with partial relief but not complete.  She denies having exertional chest pain tightness squeezing pressure burning chest.  Her EKG today shows some T inversion however there is a similar to prior EKG.  Otherwise she seems to be doing well.  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 03/13/2019   Cervicalgia    Diabetes mellitus (HCC) 12/15/2017   Fatty liver    Glaucoma    Hyperlipidemia    Hypertension    Moderate persistent asthma with acute exacerbation 02/17/2018   Morbid obesity (HCC)    Neuropathy 07/26/2014   Obesity (BMI 30-39.9) 12/15/2017   Pedal edema    Prediabetes    Primary angle-closure glaucoma 05/20/2017   Overview:  Added automatically from request for surgery [id number]   Seasonal allergic rhinitis due to pollen 02/17/2018   Strain of left shoulder 12/07/2016   Vitamin D deficiency     Past Surgical History:  Procedure Laterality Date   CESAREAN SECTION  1987   CESAREAN SECTION     EYE SURGERY Bilateral 2016   EYE SURGERY      2016, 2017, 2019   NECK SURGERY  2010,2015   NECK SURGERY     2010, 2015, 2020 - [facility] Dr. Gerlene Fee, then Dr. Noel Gerold    Current Medications: Current Meds  Medication Sig   albuterol (VENTOLIN HFA) 108 (90 Base) MCG/ACT inhaler Inhale 2 puffs into the lungs every 6 (six) hours as needed for shortness of breath.   aspirin EC 81 MG tablet Take 1 tablet (81 mg total) by mouth daily. Swallow whole.   EPINEPHrine 0.3 mg/0.3 mL IJ SOAJ injection Inject 0.3 mg into the muscle as needed for anaphylaxis.   gabapentin (NEURONTIN) 800 MG tablet Take 300 mg by mouth as needed for pain.   latanoprost (XALATAN) 0.005 % ophthalmic solution Place 1 drop into both eyes at bedtime.   triamterene-hydrochlorothiazide (DYAZIDE) 37.5-25 MG capsule Take 1 capsule by mouth daily.   TRULICITY 1.5 MG/0.5ML SOPN Inject 0.5 mLs into the skin once a week.   [DISCONTINUED] atorvastatin (LIPITOR) 20 MG tablet Take 1 tablet (20 mg total) by mouth daily.   [DISCONTINUED] BELBUCA 300 MCG FILM Take 1 Film by mouth 2 (two) times daily.   [DISCONTINUED] celecoxib (CELEBREX) 200 MG capsule Take 200 mg by mouth 2 (two) times daily.   [DISCONTINUED] cyclobenzaprine (FLEXERIL) 10 MG tablet Take 10 mg by mouth at bedtime as needed for  muscle spasms.   [DISCONTINUED] metFORMIN (GLUCOPHAGE-XR) 500 MG 24 hr tablet Take 500 mg by mouth daily.     Allergies:   Alpha-gal, Beef (bovine) protein, and Galactose   Social History   Socioeconomic History   Marital status: Married    Spouse name: Not on file   Number of children: Not on file   Years of education: Not on file   Highest education level: Not on file  Occupational History   Not on file  Tobacco Use   Smoking status: Former    Packs/day: 1.00    Years: 20.00    Total pack years: 20.00    Types: Cigarettes   Smokeless tobacco: Never  Substance and Sexual Activity   Alcohol use: Yes   Drug use: No   Sexual activity: Not on file  Other Topics Concern   Not on file   Social History Narrative   ** Merged History Encounter **       Social Determinants of Health   Financial Resource Strain: Not on file  Food Insecurity: Not on file  Transportation Needs: Not on file  Physical Activity: Not on file  Stress: Not on file  Social Connections: Not on file     Family History: The patient's family history includes Aneurysm in her sister; Asthma in her mother; Diabetes in her maternal grandmother and mother; High blood pressure in her brother, brother, mother, sister, and sister; Hyperlipidemia in her mother and sister; Hypertension in her brother, mother, and sister; Kidney disease in her mother; Tuberculosis in her mother. ROS:   Please see the history of present illness.    All 14 point review of systems negative except as described per history of present illness  EKGs/Labs/Other Studies Reviewed:      Recent Labs: No results found for requested labs within last 365 days.  Recent Lipid Panel No results found for: "CHOL", "TRIG", "HDL", "CHOLHDL", "VLDL", "LDLCALC", "LDLDIRECT"  Physical Exam:    VS:  BP (!) 144/90 (BP Location: Left Arm, Patient Position: Sitting)   Pulse 80   Ht 4\' 11"  (1.499 m)   Wt 201 lb 6.4 oz (91.4 kg)   SpO2 97%   BMI 40.68 kg/m     Wt Readings from Last 3 Encounters:  04/13/22 201 lb 6.4 oz (91.4 kg)  10/27/21 187 lb (84.8 kg)  03/19/21 200 lb 3.2 oz (90.8 kg)     GEN:  Well nourished, well developed in no acute distress HEENT: Normal NECK: No JVD; No carotid bruits LYMPHATICS: No lymphadenopathy CARDIAC: RRR, no murmurs, no rubs, no gallops RESPIRATORY:  Clear to auscultation without rales, wheezing or rhonchi  ABDOMEN: Soft, non-tender, non-distended MUSCULOSKELETAL:  No edema; No deformity  SKIN: Warm and dry LOWER EXTREMITIES: no swelling NEUROLOGIC:  Alert and oriented x 3 PSYCHIATRIC:  Normal affect   ASSESSMENT:    1. Atypical chest pain   2. Type 2 diabetes mellitus without complication,  without long-term current use of insulin (HCC)   3. Mixed hyperlipidemia   4. Body mass index (BMI) 39.0-39.9, adult    PLAN:    In order of problems listed above:  Chest pain very atypical does not have any characteristic of cardiac pain.  Still since she is a woman with multiple risk factors for coronary artery disease I will ask her to have troponin I make sure were not missing anything.  She is taking over the antiplatelets therapy in form of aspirin which I will continue. Type 2 diabetes  that being followed by internal medicine team, I did review K PN which show me her hemoglobin A1c of 5.5.  Continue present management. Dyslipidemia she is taking Lipitor 20 which is considered of moderate intensity statin last blood test from September 2023 showed LDL 149 HDL 53.  I will double the dose of Lipitor to 40 and fasting lipid profile will be checked later. I heard that the pain was very atypical troponin will be done if pain change characteristic ask her to let me know   Medication Adjustments/Labs and Tests Ordered: Current medicines are reviewed at length with the patient today.  Concerns regarding medicines are outlined above.  No orders of the defined types were placed in this encounter.  Medication changes: No orders of the defined types were placed in this encounter.   Signed, Park Liter, MD, [facility] 04/13/2022 1:42 PM    [facility]

## 2022-04-14 LAB — TROPONIN T: Troponin T (Highly Sensitive): <6 ng/L (ref 0–14)

## 2022-07-23 LAB — HEMOGLOBIN A1C: A1c: 5.6

## 2022-07-25 LAB — MICROALBUMIN / CREATININE URINE RATIO
Albumin, Urine POC: 11.1
Albumin/Creatinine Ratio, Urine, POC: 4
Creatinine, Urine.: 261.5

## 2022-07-25 LAB — COMPREHENSIVE METABOLIC PANEL: EGFR: 102

## 2022-10-14 DIAGNOSIS — Z6838 Body mass index (BMI) 38.0-38.9, adult: Secondary | ICD-10-CM | POA: Diagnosis not present

## 2022-10-14 DIAGNOSIS — E559 Vitamin D deficiency, unspecified: Secondary | ICD-10-CM | POA: Diagnosis not present

## 2022-10-14 DIAGNOSIS — M47816 Spondylosis without myelopathy or radiculopathy, lumbar region: Secondary | ICD-10-CM | POA: Diagnosis not present

## 2022-10-14 DIAGNOSIS — G894 Chronic pain syndrome: Secondary | ICD-10-CM | POA: Diagnosis not present

## 2022-10-14 DIAGNOSIS — R030 Elevated blood-pressure reading, without diagnosis of hypertension: Secondary | ICD-10-CM | POA: Diagnosis not present

## 2022-10-14 DIAGNOSIS — E119 Type 2 diabetes mellitus without complications: Secondary | ICD-10-CM | POA: Diagnosis not present

## 2022-10-14 DIAGNOSIS — Z79899 Other long term (current) drug therapy: Secondary | ICD-10-CM | POA: Diagnosis not present

## 2022-11-12 DIAGNOSIS — R030 Elevated blood-pressure reading, without diagnosis of hypertension: Secondary | ICD-10-CM | POA: Diagnosis not present

## 2022-11-12 DIAGNOSIS — Z79899 Other long term (current) drug therapy: Secondary | ICD-10-CM | POA: Diagnosis not present

## 2022-11-12 DIAGNOSIS — Z6838 Body mass index (BMI) 38.0-38.9, adult: Secondary | ICD-10-CM | POA: Diagnosis not present

## 2022-11-12 DIAGNOSIS — M47816 Spondylosis without myelopathy or radiculopathy, lumbar region: Secondary | ICD-10-CM | POA: Diagnosis not present

## 2022-11-12 DIAGNOSIS — G894 Chronic pain syndrome: Secondary | ICD-10-CM | POA: Diagnosis not present

## 2022-11-12 DIAGNOSIS — E119 Type 2 diabetes mellitus without complications: Secondary | ICD-10-CM | POA: Diagnosis not present

## 2022-11-12 DIAGNOSIS — E559 Vitamin D deficiency, unspecified: Secondary | ICD-10-CM | POA: Diagnosis not present

## 2022-12-02 DIAGNOSIS — E119 Type 2 diabetes mellitus without complications: Secondary | ICD-10-CM | POA: Diagnosis not present

## 2022-12-10 DIAGNOSIS — G894 Chronic pain syndrome: Secondary | ICD-10-CM | POA: Diagnosis not present

## 2022-12-10 DIAGNOSIS — Z6836 Body mass index (BMI) 36.0-36.9, adult: Secondary | ICD-10-CM | POA: Diagnosis not present

## 2022-12-10 DIAGNOSIS — R030 Elevated blood-pressure reading, without diagnosis of hypertension: Secondary | ICD-10-CM | POA: Diagnosis not present

## 2022-12-10 DIAGNOSIS — M47816 Spondylosis without myelopathy or radiculopathy, lumbar region: Secondary | ICD-10-CM | POA: Diagnosis not present

## 2022-12-10 DIAGNOSIS — E559 Vitamin D deficiency, unspecified: Secondary | ICD-10-CM | POA: Diagnosis not present

## 2022-12-10 DIAGNOSIS — M62838 Other muscle spasm: Secondary | ICD-10-CM | POA: Diagnosis not present

## 2022-12-10 DIAGNOSIS — E119 Type 2 diabetes mellitus without complications: Secondary | ICD-10-CM | POA: Diagnosis not present

## 2022-12-15 DIAGNOSIS — H25813 Combined forms of age-related cataract, bilateral: Secondary | ICD-10-CM | POA: Diagnosis not present

## 2022-12-15 DIAGNOSIS — H04123 Dry eye syndrome of bilateral lacrimal glands: Secondary | ICD-10-CM | POA: Diagnosis not present

## 2022-12-15 DIAGNOSIS — H401111 Primary open-angle glaucoma, right eye, mild stage: Secondary | ICD-10-CM | POA: Diagnosis not present

## 2022-12-15 DIAGNOSIS — H401123 Primary open-angle glaucoma, left eye, severe stage: Secondary | ICD-10-CM | POA: Diagnosis not present

## 2023-01-02 DIAGNOSIS — E559 Vitamin D deficiency, unspecified: Secondary | ICD-10-CM | POA: Diagnosis not present

## 2023-01-02 DIAGNOSIS — Z6836 Body mass index (BMI) 36.0-36.9, adult: Secondary | ICD-10-CM | POA: Diagnosis not present

## 2023-01-02 DIAGNOSIS — E119 Type 2 diabetes mellitus without complications: Secondary | ICD-10-CM | POA: Diagnosis not present

## 2023-01-02 DIAGNOSIS — M47816 Spondylosis without myelopathy or radiculopathy, lumbar region: Secondary | ICD-10-CM | POA: Diagnosis not present

## 2023-01-02 DIAGNOSIS — M62838 Other muscle spasm: Secondary | ICD-10-CM | POA: Diagnosis not present

## 2023-01-02 DIAGNOSIS — M129 Arthropathy, unspecified: Secondary | ICD-10-CM | POA: Diagnosis not present

## 2023-01-02 DIAGNOSIS — Z79899 Other long term (current) drug therapy: Secondary | ICD-10-CM | POA: Diagnosis not present

## 2023-01-12 DIAGNOSIS — H04123 Dry eye syndrome of bilateral lacrimal glands: Secondary | ICD-10-CM | POA: Diagnosis not present

## 2023-01-12 DIAGNOSIS — H2513 Age-related nuclear cataract, bilateral: Secondary | ICD-10-CM | POA: Diagnosis not present

## 2023-01-12 DIAGNOSIS — Q150 Congenital glaucoma: Secondary | ICD-10-CM | POA: Diagnosis not present

## 2023-02-01 DIAGNOSIS — E119 Type 2 diabetes mellitus without complications: Secondary | ICD-10-CM | POA: Diagnosis not present

## 2023-02-01 DIAGNOSIS — R030 Elevated blood-pressure reading, without diagnosis of hypertension: Secondary | ICD-10-CM | POA: Diagnosis not present

## 2023-02-01 DIAGNOSIS — Z6837 Body mass index (BMI) 37.0-37.9, adult: Secondary | ICD-10-CM | POA: Diagnosis not present

## 2023-02-01 DIAGNOSIS — E6609 Other obesity due to excess calories: Secondary | ICD-10-CM | POA: Diagnosis not present

## 2023-02-01 DIAGNOSIS — M47816 Spondylosis without myelopathy or radiculopathy, lumbar region: Secondary | ICD-10-CM | POA: Diagnosis not present

## 2023-02-01 DIAGNOSIS — M62838 Other muscle spasm: Secondary | ICD-10-CM | POA: Diagnosis not present

## 2023-02-01 DIAGNOSIS — Z79899 Other long term (current) drug therapy: Secondary | ICD-10-CM | POA: Diagnosis not present

## 2023-02-03 DIAGNOSIS — E1169 Type 2 diabetes mellitus with other specified complication: Secondary | ICD-10-CM | POA: Diagnosis not present

## 2023-02-03 DIAGNOSIS — K760 Fatty (change of) liver, not elsewhere classified: Secondary | ICD-10-CM | POA: Diagnosis not present

## 2023-02-03 DIAGNOSIS — I10 Essential (primary) hypertension: Secondary | ICD-10-CM | POA: Diagnosis not present

## 2023-02-03 DIAGNOSIS — E785 Hyperlipidemia, unspecified: Secondary | ICD-10-CM | POA: Diagnosis not present

## 2023-02-15 DIAGNOSIS — H401132 Primary open-angle glaucoma, bilateral, moderate stage: Secondary | ICD-10-CM | POA: Diagnosis not present

## 2023-02-15 DIAGNOSIS — H2513 Age-related nuclear cataract, bilateral: Secondary | ICD-10-CM | POA: Diagnosis not present

## 2023-02-15 DIAGNOSIS — E119 Type 2 diabetes mellitus without complications: Secondary | ICD-10-CM | POA: Diagnosis not present

## 2023-02-23 DIAGNOSIS — Q140 Congenital malformation of vitreous humor: Secondary | ICD-10-CM | POA: Diagnosis not present

## 2023-02-23 DIAGNOSIS — H269 Unspecified cataract: Secondary | ICD-10-CM | POA: Diagnosis not present

## 2023-02-23 DIAGNOSIS — H04123 Dry eye syndrome of bilateral lacrimal glands: Secondary | ICD-10-CM | POA: Diagnosis not present

## 2023-03-02 DIAGNOSIS — M62838 Other muscle spasm: Secondary | ICD-10-CM | POA: Diagnosis not present

## 2023-03-02 DIAGNOSIS — R030 Elevated blood-pressure reading, without diagnosis of hypertension: Secondary | ICD-10-CM | POA: Diagnosis not present

## 2023-03-02 DIAGNOSIS — M47816 Spondylosis without myelopathy or radiculopathy, lumbar region: Secondary | ICD-10-CM | POA: Diagnosis not present

## 2023-03-02 DIAGNOSIS — E559 Vitamin D deficiency, unspecified: Secondary | ICD-10-CM | POA: Diagnosis not present

## 2023-03-02 DIAGNOSIS — Z79899 Other long term (current) drug therapy: Secondary | ICD-10-CM | POA: Diagnosis not present

## 2023-03-04 DIAGNOSIS — E119 Type 2 diabetes mellitus without complications: Secondary | ICD-10-CM | POA: Diagnosis not present

## 2023-04-01 DIAGNOSIS — M62838 Other muscle spasm: Secondary | ICD-10-CM | POA: Diagnosis not present

## 2023-04-01 DIAGNOSIS — R030 Elevated blood-pressure reading, without diagnosis of hypertension: Secondary | ICD-10-CM | POA: Diagnosis not present

## 2023-04-01 DIAGNOSIS — E559 Vitamin D deficiency, unspecified: Secondary | ICD-10-CM | POA: Diagnosis not present

## 2023-04-01 DIAGNOSIS — Z6836 Body mass index (BMI) 36.0-36.9, adult: Secondary | ICD-10-CM | POA: Diagnosis not present

## 2023-04-01 DIAGNOSIS — E6609 Other obesity due to excess calories: Secondary | ICD-10-CM | POA: Diagnosis not present

## 2023-04-01 DIAGNOSIS — M47816 Spondylosis without myelopathy or radiculopathy, lumbar region: Secondary | ICD-10-CM | POA: Diagnosis not present

## 2023-04-01 DIAGNOSIS — Z79899 Other long term (current) drug therapy: Secondary | ICD-10-CM | POA: Diagnosis not present

## 2023-04-01 DIAGNOSIS — E7800 Pure hypercholesterolemia, unspecified: Secondary | ICD-10-CM | POA: Diagnosis not present

## 2023-04-06 DIAGNOSIS — Z6830 Body mass index (BMI) 30.0-30.9, adult: Secondary | ICD-10-CM | POA: Diagnosis not present

## 2023-04-06 DIAGNOSIS — Z7902 Long term (current) use of antithrombotics/antiplatelets: Secondary | ICD-10-CM | POA: Diagnosis not present

## 2023-04-06 DIAGNOSIS — Z7951 Long term (current) use of inhaled steroids: Secondary | ICD-10-CM | POA: Diagnosis not present

## 2023-04-06 DIAGNOSIS — Z7985 Long-term (current) use of injectable non-insulin antidiabetic drugs: Secondary | ICD-10-CM | POA: Diagnosis not present

## 2023-04-06 DIAGNOSIS — R059 Cough, unspecified: Secondary | ICD-10-CM | POA: Diagnosis not present

## 2023-04-06 DIAGNOSIS — I10 Essential (primary) hypertension: Secondary | ICD-10-CM | POA: Diagnosis not present

## 2023-04-06 DIAGNOSIS — Z91014 Allergy to mammalian meats: Secondary | ICD-10-CM | POA: Diagnosis not present

## 2023-04-06 DIAGNOSIS — E669 Obesity, unspecified: Secondary | ICD-10-CM | POA: Diagnosis not present

## 2023-04-06 DIAGNOSIS — Z1152 Encounter for screening for COVID-19: Secondary | ICD-10-CM | POA: Diagnosis not present

## 2023-04-06 DIAGNOSIS — E1140 Type 2 diabetes mellitus with diabetic neuropathy, unspecified: Secondary | ICD-10-CM | POA: Diagnosis not present

## 2023-04-06 DIAGNOSIS — R0981 Nasal congestion: Secondary | ICD-10-CM | POA: Diagnosis not present

## 2023-04-06 DIAGNOSIS — J069 Acute upper respiratory infection, unspecified: Secondary | ICD-10-CM | POA: Diagnosis not present

## 2023-04-06 DIAGNOSIS — Z87891 Personal history of nicotine dependence: Secondary | ICD-10-CM | POA: Diagnosis not present

## 2023-04-06 DIAGNOSIS — Z7982 Long term (current) use of aspirin: Secondary | ICD-10-CM | POA: Diagnosis not present

## 2023-04-06 DIAGNOSIS — J029 Acute pharyngitis, unspecified: Secondary | ICD-10-CM | POA: Diagnosis not present

## 2023-04-06 DIAGNOSIS — Z888 Allergy status to other drugs, medicaments and biological substances status: Secondary | ICD-10-CM | POA: Diagnosis not present

## 2023-04-16 ENCOUNTER — Other Ambulatory Visit: Payer: Self-pay | Admitting: Cardiology

## 2023-04-18 ENCOUNTER — Other Ambulatory Visit: Payer: Self-pay | Admitting: Cardiology

## 2023-05-02 DIAGNOSIS — E6609 Other obesity due to excess calories: Secondary | ICD-10-CM | POA: Diagnosis not present

## 2023-05-02 DIAGNOSIS — Z6836 Body mass index (BMI) 36.0-36.9, adult: Secondary | ICD-10-CM | POA: Diagnosis not present

## 2023-05-02 DIAGNOSIS — E559 Vitamin D deficiency, unspecified: Secondary | ICD-10-CM | POA: Diagnosis not present

## 2023-05-02 DIAGNOSIS — M47816 Spondylosis without myelopathy or radiculopathy, lumbar region: Secondary | ICD-10-CM | POA: Diagnosis not present

## 2023-05-02 DIAGNOSIS — R030 Elevated blood-pressure reading, without diagnosis of hypertension: Secondary | ICD-10-CM | POA: Diagnosis not present

## 2023-05-02 DIAGNOSIS — M62838 Other muscle spasm: Secondary | ICD-10-CM | POA: Diagnosis not present

## 2023-05-02 DIAGNOSIS — E7800 Pure hypercholesterolemia, unspecified: Secondary | ICD-10-CM | POA: Diagnosis not present

## 2023-05-02 DIAGNOSIS — Z79899 Other long term (current) drug therapy: Secondary | ICD-10-CM | POA: Diagnosis not present

## 2023-06-13 ENCOUNTER — Encounter: Payer: Self-pay | Admitting: Cardiology

## 2023-06-13 ENCOUNTER — Ambulatory Visit: Payer: Medicare Other | Attending: Cardiology | Admitting: Cardiology

## 2023-06-13 VITALS — BP 118/74 | HR 85 | Ht 60.0 in | Wt 185.6 lb

## 2023-06-13 DIAGNOSIS — E782 Mixed hyperlipidemia: Secondary | ICD-10-CM

## 2023-06-13 DIAGNOSIS — R0789 Other chest pain: Secondary | ICD-10-CM | POA: Diagnosis not present

## 2023-06-13 DIAGNOSIS — I10 Essential (primary) hypertension: Secondary | ICD-10-CM

## 2023-06-13 DIAGNOSIS — E119 Type 2 diabetes mellitus without complications: Secondary | ICD-10-CM | POA: Diagnosis not present

## 2023-06-13 NOTE — Progress Notes (Signed)
Cardiology Office Note:    Date:  06/13/2023   ID:  Dorothy Morgan, DOB 11/09/1970, MRN [id number]  PCP:  Ailene Ravel, MD  Cardiologist:  Gypsy Balsam, MD    Referring MD: Ailene Ravel, MD   Chief Complaint  Patient presents with   Follow-up    History of Present Illness:    Dorothy Morgan is a 53 y.o. female past medical history significant for multiple recent coronary artery disease including diabetes dyslipidemia essential hypertension.  She did have a stress test done which showed normal ST segment.  She does also dyslipidemia.  Comes today to months for follow-up overall doing well.  Denies having actually chest no palpitation or dizziness    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 03/13/2019   Cervicalgia    Diabetes mellitus (HCC) 12/15/2017   Fatty liver    Glaucoma    Hyperlipidemia    Hypertension    Moderate persistent asthma with acute exacerbation 02/17/2018   Morbid obesity (HCC)    Neuropathy 07/26/2014   Obesity (BMI 30-39.9) 12/15/2017   Pedal edema    Prediabetes    Primary angle-closure glaucoma 05/20/2017   Overview:  Added automatically from request for surgery [id number]   Seasonal allergic rhinitis due to pollen 02/17/2018   Strain of left shoulder 12/07/2016   Vitamin D deficiency     Past Surgical History:  Procedure Laterality Date   CESAREAN SECTION  1987   CESAREAN SECTION     EYE SURGERY Bilateral 2016   EYE SURGERY     2016, 2017, 2019   NECK SURGERY  2010,2015   NECK SURGERY     2010, 2015, 2020 - [facility] Dr. Gerlene Fee, then Dr. Noel Gerold    Current Medications: Current Meds  Medication Sig   albuterol (VENTOLIN HFA) 108 (90 Base) MCG/ACT inhaler Inhale 2 puffs into the lungs every 6 (six) hours as needed for shortness of breath.   aspirin EC 81 MG tablet Take 1 tablet (81 mg total) by  mouth daily. Swallow whole.   atorvastatin (LIPITOR) 40 MG tablet Take 1 tablet (40 mg total) by mouth daily. Patient needs to make a follow up appointment for further refills. First attempt   EPINEPHrine 0.3 mg/0.3 mL IJ SOAJ injection Inject 0.3 mg into the muscle as needed for anaphylaxis.   gabapentin (NEURONTIN) 800 MG tablet Take 300 mg by mouth as needed for pain.   latanoprost (XALATAN) 0.005 % ophthalmic solution Place 1 drop into both eyes at bedtime.   triamterene-hydrochlorothiazide (DYAZIDE) 37.5-25 MG capsule Take 1 capsule by mouth daily.   TRULICITY 1.5 MG/0.5ML SOPN Inject 0.5 mLs into the skin once a week.     Allergies:   Alpha-gal, Beef (bovine) protein, Beef allergy, Galactose, and Gelatin   Social History   Socioeconomic History   Marital status: Married    Spouse name: Not on file   Number of children: Not on file   Years of education: Not on file   Highest education level: Not on file  Occupational History   Not on file  Tobacco Use   Smoking status: Former    Current packs/day: 1.00    Average packs/day: 1 pack/day for 20.0 years (20.0 ttl pk-yrs)    Types: Cigarettes   Smokeless tobacco: Never  Substance and Sexual Activity   Alcohol  use: Yes   Drug use: No   Sexual activity: Not on file  Other Topics Concern   Not on file  Social History Narrative   ** Merged History Encounter **       Social Drivers of Corporate investment banker Strain: Not on file  Food Insecurity: Not on file  Transportation Needs: Not on file  Physical Activity: Not on file  Stress: Not on file  Social Connections: Not on file     Family History: The patient's family history includes Aneurysm in her sister; Asthma in her mother; Diabetes in her maternal grandmother and mother; High blood pressure in her brother, brother, mother, sister, and sister; Hyperlipidemia in her mother and sister; Hypertension in her brother, mother, and sister; Kidney disease in her mother;  Tuberculosis in her mother. ROS:   Please see the history of present illness.    All 14 point review of systems negative except as described per history of present illness  EKGs/Labs/Other Studies Reviewed:    EKG Interpretation Date/Time:  Monday June 13 2023 11:35:41 EST Ventricular Rate:  85 PR Interval:  168 QRS Duration:  82 QT Interval:  350 QTC Calculation: 416 R Axis:   -16  Text Interpretation: Normal sinus rhythm Nonspecific T wave abnormality Abnormal ECG When compared with ECG of 10-May-2008 10:59, Questionable change in QRS axis Nonspecific T wave abnormality now evident in Anterolateral leads Confirmed by Gypsy Balsam [phone]) on 06/13/2023 11:52:37 AM    Recent Labs: No results found for requested labs within last 365 days.  Recent Lipid Panel No results found for: "CHOL", "TRIG", "HDL", "CHOLHDL", "VLDL", "LDLCALC", "LDLDIRECT"  Physical Exam:    VS:  BP 118/74 (BP Location: Right Arm, Patient Position: Sitting)   Pulse 85   Ht 5' (1.524 m)   Wt 185 lb 9.6 oz (84.2 kg)   SpO2 97%   BMI 36.25 kg/m     Wt Readings from Last 3 Encounters:  06/13/23 185 lb 9.6 oz (84.2 kg)  04/13/22 201 lb 6.4 oz (91.4 kg)  10/27/21 187 lb (84.8 kg)     GEN:  Well nourished, well developed in no acute distress HEENT: Normal NECK: No JVD; No carotid bruits LYMPHATICS: No lymphadenopathy CARDIAC: RRR, no murmurs, no rubs, no gallops RESPIRATORY:  Clear to auscultation without rales, wheezing or rhonchi  ABDOMEN: Soft, non-tender, non-distended MUSCULOSKELETAL:  No edema; No deformity  SKIN: Warm and dry LOWER EXTREMITIES: no swelling NEUROLOGIC:  Alert and oriented x 3 PSYCHIATRIC:  Normal affect   ASSESSMENT:    1. Atypical chest pain   2. Mixed hyperlipidemia   3. Type 2 diabetes mellitus without complication, without long-term current use of insulin (HCC)   4. Primary hypertension    PLAN:    In order of problems listed above:  Atypical chest pain:  Asymptomatic does have episode of atypical chest pain lasting 4 days.  Worse with touching chest worse with his overall medical. Dyslipidemia I did review KPN  reviewed showing LDL 101 HDL 40.  However she admits that she did not take her medication on a regular basis now she is going Lipitor 40 every single day we will recheck her fasting lipid profile today. Type 2 diabetes followed by antimedicine team, hemoglobin A1c 5.6 good control continue present management. Essential hypertension controlled.   Medication Adjustments/Labs and Tests Ordered: Current medicines are reviewed at length with the patient today.  Concerns regarding medicines are outlined above.  Orders Placed This Encounter  Procedures   Lipid panel   EKG 12-Lead   Medication changes: No orders of the defined types were placed in this encounter.   Signed, Georgeanna Lea, MD, [facility] 06/13/2023 12:02 PM    [facility]

## 2023-06-13 NOTE — Patient Instructions (Signed)
 Medication Instructions:  Your physician recommends that you continue on your current medications as directed. Please refer to the Current Medication list given to you today.  *If you need a refill on your cardiac medications before your next appointment, please call your pharmacy*   Lab Work: Lipid panel- today If you have labs (blood work) drawn today and your tests are completely normal, you will receive your results only by: MyChart Message (if you have MyChart) OR A paper copy in the mail If you have any lab test that is abnormal or we need to change your treatment, we will call you to review the results.   Testing/Procedures: None Ordered   Follow-Up: At [facility], you and your health needs are our priority.  As part of our continuing mission to provide you with exceptional heart care, we have created designated Provider Care Teams.  These Care Teams include your primary Cardiologist (physician) and Advanced Practice Providers (APPs -  Physician Assistants and Nurse Practitioners) who all work together to provide you with the care you need, when you need it.  We recommend signing up for the patient portal called "MyChart".  Sign up information is provided on this After Visit Summary.  MyChart is used to connect with patients for Virtual Visits (Telemedicine).  Patients are able to view lab/test results, encounter notes, upcoming appointments, etc.  Non-urgent messages can be sent to your provider as well.   To learn more about what you can do with MyChart, go to [email or web address].    Your next appointment:   12 month(s)  The format for your next appointment:   In Person  Provider:   Ralene Burger, MD    Other Instructions NA

## 2023-06-14 LAB — LIPID PANEL
Chol/HDL Ratio: 3.9 {ratio} (ref 0.0–4.4)
Cholesterol, Total: 163 mg/dL (ref 100–199)
HDL: 42 mg/dL (ref >39)
LDL Chol Calc (NIH): 102 mg/dL — ABNORMAL HIGH (ref 0–99)
Triglycerides: 104 mg/dL (ref 0–149)
VLDL Cholesterol Cal: 19 mg/dL (ref 5–40)

## 2023-06-15 ENCOUNTER — Telehealth: Payer: Self-pay

## 2023-06-15 DIAGNOSIS — E782 Mixed hyperlipidemia: Secondary | ICD-10-CM

## 2023-06-15 MED ORDER — ATORVASTATIN CALCIUM 80 MG PO TABS: 80.0000 mg | ORAL_TABLET | Freq: Every day | ORAL | 3 refills | Status: DC

## 2023-06-15 NOTE — Telephone Encounter (Signed)
Spoke with pt. She will increase Lipitor to 80mg  daily and have blood work in 6 weeks. Routed to PCP.

## 2023-07-29 ENCOUNTER — Telehealth: Payer: Self-pay | Admitting: Cardiology

## 2023-07-29 NOTE — Telephone Encounter (Signed)
   Pt c/o of Chest Pain: STAT if active CP, including tightness, pressure, jaw pain, radiating pain to shoulder/upper arm/back, CP unrelieved by Nitro. Symptoms reported of SOB, nausea, vomiting, sweating.  1. Are you having CP right now? Aching and Tingling left side of her chest- not at this time    2. Are you experiencing any other symptoms (ex. SOB, nausea, vomiting, sweating)? no   3. Is your CP continuous or coming and going? comes and goes   4. Have you taken Nitroglycerin? No,    5. How long have you been experiencing CP? A few days ago - patient wants to be seen   6. If NO CP at time of call then end call with telling Pt to call back or call 911 if Chest pain returns prior to return call from triage team.

## 2023-07-29 NOTE — Telephone Encounter (Signed)
 Spoke with Dorothy Morgan regarding message about chest pain. She reported a shock like pain in her chest, maybe 3 or 4 times in the last 4 days. Lasts a few seconds. Is not associated with any other pain, shortness of breath. Denies any lower extremity swelling or weight gain. Her BP reading yesterday at the pain doctor appt was 130/90. Marland Kitchen Spoke with Dr. Bing Matter. He stated that this is atypical chest pain and if it continues he would have her see her PCP. If it occurs with exertion or exercise to let us know and make an appt. Dorothy Morgan verbalized understanding and had no further questions.

## 2023-10-18 ENCOUNTER — Other Ambulatory Visit: Payer: Self-pay | Admitting: Cardiology

## 2024-05-21 NOTE — Progress Notes (Unsigned)
 " Cardiology Office Note   Date:  05/22/2024  ID:  Dorothy Morgan, Dorothy Morgan 05/31/1970, MRN [id number] PCP: Stephanie Charlene CROME, MD  [facility] Providers Cardiologist:  Dorothy Fitch, MD     History of Present Illness Dorothy Morgan is a 54 y.o. female history of hypertension, hyperlipidemia, hypothyroidism, asthma, T2DM, former smoker, and obesity.    She is a patient of Dr. Krasowski and was initially evaluated by him 07/2020 in the setting of chest pain. Scattered coronary calcifications noted on chest CT 08/2019.She was previously hospitalized in the setting of chest pain.  Troponins negative. Chest CT showed no evidence of PE.  Echo 08/2020 LVEF 55-60%, mild LVH, no RWMA, RV normal, mild MR, and no other abnormalities noted.  Lexi stress test/2022 showed LVEF greater than 65%, nuclear stress EF 66%, no ST segment deviation noted during stress, overall low risk study.  Chest pain etiology not cardiac related.  She was last seen in office by Dr. Fitch 06/13/2023 and appeared to be doing okay from a cardiac standpoint.  She noted atypical chest pain lasting for 4 days that was worse when touching her chest.  Has been difficult to get her LDL at goal.  Noted to not be taking Lipitor daily.  She agreed to take her Lipitor daily and last LDL 06/13/2023 was 102.  She presented to the ED 05/17/24 in the setting of worsening intermittant chest pain that has occurred for two days. There was improvement with Tylenol and a muscle relaxer. Cardiac etiology was ruled out.     She presents today for 1 year follow-up in the setting of hyperlipidemia and atypical chest pain. She has noted her chest pressure located left axilla area has gradually gotten better with muscle relaxer's and Tylenol. She has a very active job and does not remember lifting anything too heavy. She reports not taking her atorvastatin . She does not check her BP at home. She denies shortness of breath, lower extremity edema, fatigue,  palpitations, melena, hematuria, hemoptysis, diaphoresis, weakness, presyncope, syncope, orthopnea, and PND.  ROS: All systems negative unless otherwise indicated in HPI.  Studies Reviewed EKG Interpretation Date/Time:  Tuesday May 22 2024 10:59:27 EST Ventricular Rate:  93 PR Interval:  168 QRS Duration:  82 QT Interval:  350 QTC Calculation: 435 R Axis:   42  Text Interpretation: Normal sinus rhythm Possible Left atrial enlargement Nonspecific T wave abnormality When compared with ECG of 13-Jun-2023 11:35, Questionable change in QRS axis Confirmed by Dorothy Morgan [id number] on 05/22/2024 11:04:42 AM    Cardiac Studies & Procedures   ______________________________________________________________________________________________   STRESS TESTS  MYOCARDIAL PERFUSION IMAGING 08/14/2020  Interpretation Summary  The left ventricular ejection fraction is hyperdynamic (>65%).  Nuclear stress EF: 66%.  There was no ST segment deviation noted during stress.  No T wave inversion was noted during stress.  This is a low risk study.   ECHOCARDIOGRAM  ECHOCARDIOGRAM COMPLETE 08/14/2020  Narrative ECHOCARDIOGRAM REPORT    Patient Name:   Dorothy Morgan Date of Exam: 08/14/2020 Medical Rec #:  [id number]       Height:       59.0 in Accession #:    [id number]      Weight:       205.0 lb Date of Birth:  May 13, 1970       BSA:          1.864 m Patient Age:    49 years        BP:  136/78 mmHg Patient Gender: F               HR:           88 bpm. Exam Location:  [facility]  Procedure: 2D Echo  Indications:    Atypical chest pain [R07.89 (ICD-10-CM)]; Primary hypertension [I10 (ICD-10-CM)]; Moderate persistent asthma, unspecified whether complicated [J45.40 (ICD-10-CM)]; Mixed hyperlipidemia [E78.2 (ICD-10-CM)]; Type 2 diabetes mellitus without complication, without long-term current use of insulin  (HCC) [E11.9 (ICD-10-CM)]  History:        Patient has no prior history of  Echocardiogram examinations.  Sonographer:    Lynwood Silvas Referring Phys: [id number] ROBERT J KRASOWSKI  IMPRESSIONS   1. Left ventricular ejection fraction, by estimation, is 55 to 60%. The left ventricle has normal function. The left ventricle has no regional wall motion abnormalities. There is mild concentric left ventricular hypertrophy. Left ventricular diastolic parameters were normal. 2. Right ventricular systolic function is normal. The right ventricular size is normal. There is normal pulmonary artery systolic pressure. 3. The mitral valve is normal in structure. Mild mitral valve regurgitation. No evidence of mitral stenosis. 4. The aortic valve is normal in structure. Aortic valve regurgitation is not visualized. No aortic stenosis is present. 5. The inferior vena cava is normal in size with greater than 50% respiratory variability, suggesting right atrial pressure of 3 mmHg.  FINDINGS Left Ventricle: Left ventricular ejection fraction, by estimation, is 55 to 60%. The left ventricle has normal function. The left ventricle has no regional wall motion abnormalities. Global longitudinal strain performed but not reported based on interpreter judgement due to suboptimal tracking. The left ventricular internal cavity size was normal in size. There is mild concentric left ventricular hypertrophy. Left ventricular diastolic parameters were normal.  Right Ventricle: The right ventricular size is normal. No increase in right ventricular wall thickness. Right ventricular systolic function is normal. There is normal pulmonary artery systolic pressure. The tricuspid regurgitant velocity is 2.12 m/s, and with an assumed right atrial pressure of 3 mmHg, the estimated right ventricular systolic pressure is 21.0 mmHg.  Left Atrium: Left atrial size was normal in size.  Right Atrium: Right atrial size was normal in size.  Pericardium: There is no evidence of pericardial effusion.  Mitral Valve: The  mitral valve is normal in structure. Mild mitral valve regurgitation. No evidence of mitral valve stenosis.  Tricuspid Valve: The tricuspid valve is normal in structure. Tricuspid valve regurgitation is trivial. No evidence of tricuspid stenosis.  Aortic Valve: The aortic valve is normal in structure. There is mild aortic valve annular calcification. Aortic valve regurgitation is not visualized. No aortic stenosis is present.  Pulmonic Valve: The pulmonic valve was normal in structure. Pulmonic valve regurgitation is not visualized. No evidence of pulmonic stenosis.  Aorta: The aortic root is normal in size and structure.  Venous: The inferior vena cava is normal in size with greater than 50% respiratory variability, suggesting right atrial pressure of 3 mmHg.  IAS/Shunts: No atrial level shunt detected by color flow Doppler.   LEFT VENTRICLE PLAX 2D LVIDd:         3.60 cm  Diastology LVIDs:         2.30 cm  LV e' medial:    9.57 cm/s LV PW:         1.10 cm  LV E/e' medial:  11.1 LV IVS:        1.10 cm  LV e' lateral:   9.25 cm/s LVOT diam:  1.90 cm  LV E/e' lateral: 11.5 LV SV:         53 LV SV Index:   28 LVOT Area:     2.84 cm   RIGHT VENTRICLE            IVC RV S prime:     9.68 cm/s  IVC diam: 1.70 cm TAPSE (M-mode): 1.8 cm  LEFT ATRIUM             Index       RIGHT ATRIUM           Index LA diam:        3.80 cm 2.04 cm/m  RA Area:     10.20 cm LA Vol (A2C):   32.4 ml 17.38 ml/m RA Volume:   21.00 ml  11.27 ml/m LA Vol (A4C):   32.0 ml 17.17 ml/m LA Biplane Vol: 33.8 ml 18.13 ml/m AORTIC VALVE LVOT Vmax:   76.40 cm/s LVOT Vmean:  53.300 cm/s LVOT VTI:    0.187 m  AORTA Ao Root diam: 2.80 cm Ao Asc diam:  2.90 cm Ao Desc diam: 2.00 cm  MITRAL VALVE                TRICUSPID VALVE MV Area (PHT): 5.09 cm     TR Peak grad:   18.0 mmHg MV Decel Time: 149 msec     TR Vmax:        212.00 cm/s MV E velocity: 106.00 cm/s MV A velocity: 95.10 cm/s   SHUNTS MV  E/A ratio:  1.11         Systemic VTI:  0.19 m Systemic Diam: 1.90 cm  Kardie Tobb DO Electronically signed by Dub Huntsman DO Signature Date/Time: 08/14/2020/1:08:55 PM    Final          ______________________________________________________________________________________________      Risk Assessment/Calculations           Physical Exam VS:  BP (!) 102/58   Pulse 93 Comment: 104 oximeter  Ht 4' 11 (1.499 m)   Wt 178 lb 9.6 oz (81 kg)   SpO2 98%   BMI 36.07 kg/m        Wt Readings from Last 3 Encounters:  05/22/24 178 lb 9.6 oz (81 kg)  06/13/23 185 lb 9.6 oz (84.2 kg)  04/13/22 201 lb 6.4 oz (91.4 kg)    GEN: Well nourished, well developed in no acute distress NECK: No JVD; No carotid bruits CARDIAC: RRR, no murmurs, rubs, gallops RESPIRATORY:  Clear to auscultation without rales, wheezing or rhonchi  ABDOMEN: Soft, non-tender, non-distended EXTREMITIES:  No edema; No deformity   ASSESSMENT AND PLAN  Coronary calcifications noted on CT- Scattered coronary calcifications noted on chest CT 08/2019. She was seen in the ED 05/17/24 in the setting of left axilla chest pain. EKG unremarkable. Troponin negative. She is concerned about her chest pain, although it is likely due to muscle strain. Denies CP, SOB, and palpitations today. She does have family history of heart disease. EKG today NSR with non specific T wave abnormality.  I think it is reasonable to proceed with coronary calcium  score.  Continue atorvastatin  80 mg and aspirin  81 mg.   Hypertension- BP today 90/65. Recheck  BP 102/58. She denies checking BP at home. Her BP is well controlled in office. She was previously not taking her Dyazide, and then restarted it as her BP was elevated in the ED on 05/17/24. She has taken it every night since.  She notes no dizziness, lightheadedness, or presyncope. Educated on symptoms of hypotension and to check BP if symptoms occur.  BP cuff order sent to pharmacy.  Continue  Dyazide 37.5-25 mg   Hyperlipidemia - Last LDL 102 on 06/13/2023. She has not been taking her atorvastatin . She is agreeable to restart atorvastatin  80 mg. Check lipid panel and LFT's in 3 months.   T2DM-last A1c 5.6 07/23/2022.  Continue follow-up with PCP.       Dispo: Follow up with Dr. Krasawski in 3 months.   Signed, Mardy KATHEE Pizza, FNP  "

## 2024-05-22 ENCOUNTER — Encounter: Payer: Self-pay | Admitting: Physician Assistant

## 2024-05-22 ENCOUNTER — Ambulatory Visit: Payer: Self-pay | Attending: Physician Assistant

## 2024-05-22 VITALS — BP 102/58 | HR 93 | Ht 59.0 in | Wt 178.6 lb

## 2024-05-22 DIAGNOSIS — E782 Mixed hyperlipidemia: Secondary | ICD-10-CM

## 2024-05-22 DIAGNOSIS — I10 Essential (primary) hypertension: Secondary | ICD-10-CM

## 2024-05-22 DIAGNOSIS — Z79899 Other long term (current) drug therapy: Secondary | ICD-10-CM

## 2024-05-22 DIAGNOSIS — E119 Type 2 diabetes mellitus without complications: Secondary | ICD-10-CM | POA: Diagnosis not present

## 2024-05-22 DIAGNOSIS — I2510 Atherosclerotic heart disease of native coronary artery without angina pectoris: Secondary | ICD-10-CM

## 2024-05-22 MED ORDER — ATORVASTATIN CALCIUM 80 MG PO TABS: 80.0000 mg | ORAL_TABLET | Freq: Every day | ORAL | 3 refills | Status: AC

## 2024-05-22 MED ORDER — BLOOD PRESSURE CUFF MISC: 0 refills | Status: AC

## 2024-05-22 NOTE — Patient Instructions (Signed)
 Medication Instructions:  Your physician recommends the following medication changes.  START TAKING: Atorvastatin  80 mg daily  *If you need a refill on your cardiac medications before your next appointment, please call your pharmacy*  Lab Work: Your provider would like for you to return in 2-3 months after starting atorvastatin  to have the following labs drawn: FASTING Lipid panel and liver function test.   Please go to [facility] [address] ([facility] [address], [place] [address] You do not need an appointment.  They are open from 8 am- 4:30 pm.  Lunch from 1:00 pm- 2:00 pm You DO need to be fasting.   You may also go to one of the following [facility]:  [address] [place], [place] [address] Phone: [phone] Lab hours: Mon-Fri 8 am- 5 pm    Lunch 12 pm- 1 pm  [address] [place],  [place]  [address]  [place]  Phone: [phone] Lab hours: 7 am- 4 pm Lunch 12 pm-1 pm   [address] [place],  [place]  [address]  [place]  Phone: [phone] Lab hours: Mon-Fri 8 am- 5 pm    Lunch 12 pm- 1 pm  If you have labs (blood work) drawn today and your tests are completely normal, you will receive your results only by: MyChart Message (if you have MyChart) OR A paper copy in the mail If you have any lab test that is abnormal or we need to change your treatment, we will call you to review the results.  Testing/Procedures: CT coronary calcium  score.   - $99 out of pocket cost at the time of your test - Call [phone] to schedule at your convenience.  Location[facility] [address] [place], [place] [address]   Coronary CalciumScan A coronary calcium  scan is an imaging test used to look for deposits of calcium  and other fatty materials (plaques) in the inner lining of the blood vessels of the heart (coronary arteries). These deposits of calcium  and plaques can partly clog and narrow the coronary arteries without  producing any symptoms or warning signs. This puts a person at risk for a heart attack. This test can detect these deposits before symptoms develop. Tell a health care provider about: Any allergies you have. All medicines you are taking, including vitamins, herbs, eye drops, creams, and over-the-counter medicines. Any problems you or family members have had with anesthetic medicines. Any blood disorders you have. Any surgeries you have had. Any medical conditions you have. Whether you are pregnant or may be pregnant. What are the risks? Generally, this is a safe procedure. However, problems may occur, including: Harm to a pregnant woman and her unborn baby. This test involves the use of radiation. Radiation exposure can be dangerous to a pregnant woman and her unborn baby. If you are pregnant, you generally should not have this procedure done. Slight increase in the risk of cancer. This is because of the radiation involved in the test. What happens before the procedure? No preparation is needed for this procedure. What happens during the procedure? You will undress and remove any jewelry around your neck or chest. You will put on a hospital gown. Sticky electrodes will be placed on your chest. The electrodes will be connected to an electrocardiogram (ECG) machine to record a tracing of the electrical activity of your heart. A CT scanner will take pictures of your heart. During this time, you will be asked to lie still and hold your breath for  2-3 seconds while a picture of your heart is being taken. The procedure may vary among health care providers and hospitals. What happens after the procedure? You can get dressed. You can return to your normal activities. It is up to you to get the results of your test. Ask your health care provider, or the department that is doing the test, when your results will be ready. Summary A coronary calcium  scan is an imaging test used to look for deposits of  calcium  and other fatty materials (plaques) in the inner lining of the blood vessels of the heart (coronary arteries). Generally, this is a safe procedure. Tell your health care provider if you are pregnant or may be pregnant. No preparation is needed for this procedure. A CT scanner will take pictures of your heart. You can return to your normal activities after the scan is done. This information is not intended to replace advice given to you by your health care provider. Make sure you discuss any questions you have with your health care provider. Document Released: 10/16/2007 Document Revised: 03/08/2016 Document Reviewed: 03/08/2016 Elsevier Interactive Patient Education  2017 [employer].   Follow-Up: At [facility], you and your health needs are our priority.  As part of our continuing mission to provide you with exceptional heart care, our providers are all part of one team.  This team includes your primary Cardiologist (physician) and Advanced Practice Providers or APPs (Physician Assistants and Nurse Practitioners) who all work together to provide you with the care you need, when you need it.  Your next appointment:   3 month(s)  Provider:   You may see Lamar Fitch, MD or one of the following Advanced Practice Providers on your designated Care Team:   Lonni Meager, NP Lesley Maffucci, PA-C Bernardino Bring, PA-C Cadence Franchester, PA-C Tylene Lunch, NP Barnie Hila, NP     Other Instructions BP cuff prescription given to carry to [facility], [address], [address], [place], [place] [address]  BP log given

## 2024-05-24 ENCOUNTER — Ambulatory Visit
Admission: RE | Admit: 2024-05-24 | Discharge: 2024-05-24 | Disposition: A | Discharge status: Home / Self Care | Payer: Self-pay | Source: Ambulatory Visit

## 2024-05-24 ENCOUNTER — Ambulatory Visit: Payer: Self-pay

## 2024-05-24 DIAGNOSIS — I2510 Atherosclerotic heart disease of native coronary artery without angina pectoris: Secondary | ICD-10-CM | POA: Insufficient documentation

## 2024-05-24 DIAGNOSIS — Z79899 Other long term (current) drug therapy: Secondary | ICD-10-CM | POA: Insufficient documentation

## 2024-05-24 DIAGNOSIS — E782 Mixed hyperlipidemia: Secondary | ICD-10-CM | POA: Insufficient documentation

## 2024-07-18 ENCOUNTER — Ambulatory Visit: Admitting: Cardiology

## 2024-08-20 ENCOUNTER — Ambulatory Visit: Admitting: Student
